# Patient Record
Sex: Male | Born: 1955 | Race: White | Hispanic: No | Marital: Married | Smoking: Former smoker
Health system: Southern US, Community
[De-identification: ages and names within clinical notes are randomized; demographics above are authoritative.]

## PROBLEM LIST (undated history)

## (undated) DIAGNOSIS — C801 Malignant (primary) neoplasm, unspecified: Secondary | ICD-10-CM

## (undated) DIAGNOSIS — I2510 Atherosclerotic heart disease of native coronary artery without angina pectoris: Secondary | ICD-10-CM

## (undated) DIAGNOSIS — O2230 Deep phlebothrombosis in pregnancy, unspecified trimester: Secondary | ICD-10-CM

## (undated) DIAGNOSIS — E785 Hyperlipidemia, unspecified: Secondary | ICD-10-CM

## (undated) DIAGNOSIS — C679 Malignant neoplasm of bladder, unspecified: Secondary | ICD-10-CM

## (undated) HISTORY — DX: Malignant neoplasm of bladder, unspecified: C67.9

## (undated) HISTORY — DX: Hyperlipidemia, unspecified: E78.5

## (undated) HISTORY — DX: Deep phlebothrombosis in pregnancy, unspecified trimester: O22.30

## (undated) HISTORY — PX: WISDOM TOOTH EXTRACTION: SHX21

## (undated) HISTORY — DX: Atherosclerotic heart disease of native coronary artery without angina pectoris: I25.10

---

## 2017-03-21 HISTORY — PX: OTHER SURGICAL HISTORY: SHX169

## 2017-10-18 ENCOUNTER — Other Ambulatory Visit: Payer: Self-pay

## 2017-10-18 ENCOUNTER — Encounter (HOSPITAL_COMMUNITY): Payer: Self-pay | Admitting: Emergency Medicine

## 2017-10-18 ENCOUNTER — Emergency Department (HOSPITAL_COMMUNITY)
Admission: EM | Admit: 2017-10-18 | Discharge: 2017-10-19 | Disposition: A | Discharge status: Home / Self Care | Payer: BLUE CROSS/BLUE SHIELD | Attending: Emergency Medicine | Admitting: Emergency Medicine

## 2017-10-18 ENCOUNTER — Emergency Department (HOSPITAL_COMMUNITY): Payer: BLUE CROSS/BLUE SHIELD

## 2017-10-18 DIAGNOSIS — R600 Localized edema: Secondary | ICD-10-CM | POA: Diagnosis not present

## 2017-10-18 DIAGNOSIS — L0390 Cellulitis, unspecified: Secondary | ICD-10-CM | POA: Diagnosis not present

## 2017-10-18 DIAGNOSIS — N492 Inflammatory disorders of scrotum: Secondary | ICD-10-CM | POA: Diagnosis not present

## 2017-10-18 DIAGNOSIS — I82411 Acute embolism and thrombosis of right femoral vein: Secondary | ICD-10-CM | POA: Diagnosis not present

## 2017-10-18 DIAGNOSIS — N433 Hydrocele, unspecified: Secondary | ICD-10-CM | POA: Diagnosis not present

## 2017-10-18 DIAGNOSIS — M7989 Other specified soft tissue disorders: Secondary | ICD-10-CM

## 2017-10-18 DIAGNOSIS — R2241 Localized swelling, mass and lump, right lower limb: Secondary | ICD-10-CM | POA: Diagnosis not present

## 2017-10-18 DIAGNOSIS — N5089 Other specified disorders of the male genital organs: Secondary | ICD-10-CM | POA: Diagnosis not present

## 2017-10-18 NOTE — ED Provider Notes (Signed)
[facility] Provider Note   CSN: [id number] Arrival date & time: 10/18/17  4431     History   Chief Complaint Chief Complaint  Patient presents with  . Leg Swelling  . Groin Swelling    HPI Robert Peters is a 62 y.o. male.  Patient states he hurt his right groin about 6 months ago while he was working out and has had an intermittent bulge in his groin since then.  Over the past several weeks he is noticed intermittent swelling in his scrotum and right leg.  He did have 2 Recent [address] trips and is quite concerned about a blood clot and was sent here by urgent care.  He states he has not had any vomiting, fever or diarrhea, change in appetite.  He is able to eat and drink well.  Able to urinate and move his bowels normally.  He denies any abdominal pain or back pain.  He denies any chest pain or shortness of breath.  He reports over the past week he is noticed increased swelling in his scrotum as well as the shaft of his penis.  He is able to urinate without a problem.  He said intermittent right leg pain and swelling for the past several weeks as well.  Denies any pain at this time.  He does not take any regular medications.  The history is provided by the patient.    History reviewed. No pertinent past medical history.  There are no active problems to display for this patient.   History reviewed. No pertinent surgical history.      Home Medications    Prior to Admission medications   Not on File    Family History No family history on file.  Social History Social History   Tobacco Use  . Smoking status: Never Smoker  . Smokeless tobacco: Never Used  Substance Use Topics  . Alcohol use: Never    Frequency: Never  . Drug use: Never     Allergies   Patient has no allergy information on record.   Review of Systems Review of Systems  Constitutional: Negative for activity change, appetite change and fever.  HENT:  Negative for congestion and rhinorrhea.   Eyes: Negative for visual disturbance.  Respiratory: Negative for cough, chest tightness and shortness of breath.   Cardiovascular: Positive for leg swelling. Negative for chest pain.  Gastrointestinal: Negative for abdominal pain, nausea and vomiting.  Genitourinary: Positive for penile swelling and scrotal swelling. Negative for dysuria, hematuria and testicular pain.  Musculoskeletal: Negative for arthralgias and myalgias.  Skin: Negative for rash.  Neurological: Negative for dizziness, weakness and headaches.  Hematological: Negative for adenopathy. Does not bruise/bleed easily.    all other systems are negative except as noted in the HPI and PMH.    Physical Exam Updated Vital Signs BP 136/89 (BP Location: Right Arm)   Pulse 69   Temp 98.7 F (37.1 C) (Oral)   Resp 18   Ht 5\' 8"  (1.727 m)   Wt 77.1 kg (170 lb)   SpO2 100%   BMI 25.85 kg/m   Physical Exam  Constitutional: He is oriented to person, place, and time. He appears well-developed and well-nourished. No distress.  HENT:  Head: Normocephalic and atraumatic.  Mouth/Throat: Oropharynx is clear and moist. No oropharyngeal exudate.  Eyes: Pupils are equal, round, and reactive to light. Conjunctivae and EOM are normal.  Neck: Normal range of motion. Neck supple.  No meningismus.  Cardiovascular: Normal rate, regular rhythm, normal heart sounds and intact distal pulses.  No murmur heard. Pulmonary/Chest: Effort normal and breath sounds normal. No respiratory distress.  Abdominal: Soft. There is no tenderness. There is no rebound and no guarding.  No hernia appreciated. R inguinal LAD present.  Genitourinary: Penile tenderness present.  Genitourinary Comments: There is edema to the penile shaft as well as scrotum diffusely.  There is mild erythema.  There is no testicular tenderness.  Intact femoral pulses bilaterally  Musculoskeletal: Normal range of motion. He exhibits  edema. He exhibits no tenderness.  Asymmetric right-sided leg edema.  There is no calf tenderness.  Compartments are soft.  Intact PT pulse with Doppler.  Foot appears cool.  Neurological: He is alert and oriented to person, place, and time. No cranial nerve deficit. He exhibits normal muscle tone. Coordination normal.  No ataxia on finger to nose bilaterally. No pronator drift. 5/5 strength throughout. CN 2-12 intact.Equal grip strength. Sensation intact.   Skin: Skin is warm. No rash noted.  Psychiatric: He has a normal mood and affect. His behavior is normal.  Nursing note and vitals reviewed.    ED Treatments / Results  Labs (all labs ordered are listed, but only abnormal results are displayed) Labs Reviewed  COMPREHENSIVE METABOLIC PANEL - Abnormal; Notable for the following components:      Result Value   Glucose, Bld 100 (*)    BUN 26 (*)    Albumin 3.4 (*)    All other components within normal limits  URINALYSIS, ROUTINE W REFLEX MICROSCOPIC - Abnormal; Notable for the following components:   Color, Urine STRAW (*)    Hgb urine dipstick SMALL (*)    All other components within normal limits  D-DIMER, QUANTITATIVE (NOT AT [facility]) - Abnormal; Notable for the following components:   D-Dimer, Quant 1.37 (*)    All other components within normal limits  CBC WITH DIFFERENTIAL/PLATELET  BRAIN NATRIURETIC PEPTIDE    EKG None  Radiology Ct Angio Ao+bifem W & Or Wo Contrast  Result Date: 10/19/2017 CLINICAL DATA:  Leg pain or swelling. DVT suspected. Patient reports right groin and scrotal swelling. Increased right leg swelling. EXAM: CT ANGIOGRAPHY OF ABDOMINAL AORTA WITH ILIOFEMORAL RUNOFF TECHNIQUE: Multidetector CT imaging of the abdomen, pelvis and lower extremities was performed using the standard protocol during bolus administration of intravenous contrast. Multiplanar CT image reconstructions and MIPs were obtained to evaluate the vascular anatomy. CONTRAST:  193mL  ISOVUE-370 IOPAMIDOL (ISOVUE-370) INJECTION 76% COMPARISON:  None. FINDINGS: VASCULAR Aorta: Normal caliber aorta without aneurysm, dissection, vasculitis or significant stenosis. Celiac: Patent without evidence of aneurysm, dissection, vasculitis or significant stenosis. SMA: Patent without evidence of aneurysm, dissection, vasculitis or significant stenosis. Renals: 3 right and 3 left renal arteries, all of which are patent. No evidence of aneurysm, dissection, vasculitis or fibromuscular dysplasia. IMA: Patent without evidence of aneurysm, dissection, vasculitis or significant stenosis. RIGHT Lower Extremity Inflow: Common, internal and external iliac arteries are patent without evidence of aneurysm, dissection, vasculitis or significant stenosis. Mild atherosclerosis of the right common iliac artery. Outflow: Common, superficial and profunda femoral arteries and the popliteal artery are patent without evidence of aneurysm, dissection, vasculitis or significant stenosis. Runoff: Patent three vessel runoff to the ankle. LEFT Lower Extremity Inflow: Common, internal and external iliac arteries are patent without evidence of aneurysm, dissection, vasculitis or significant stenosis. Outflow: Common, superficial and profunda femoral arteries and the popliteal artery are patent without evidence of aneurysm, dissection, vasculitis or significant stenosis.  Runoff: Patent three vessel runoff to the ankle. Veins: Venous structures not well assessed on this dedicated arterial phase exam. Particularly, cannot assess for DVT. Review of the MIP images confirms the above findings. NON-VASCULAR Lower chest: Atelectasis in both lower lobes.  No pleural fluid. Hepatobiliary: Subcentimeter enhancing focus in the right lobe of the liver nonspecific, may be an AP shunt or flash filling hemangioma. Subcentimeter cyst in the left hepatic lobe. Arterial phase exam not tailored for detailed hepatic evaluation. Gallbladder physiologically  distended, no calcified stone. No biliary dilatation. Pancreas: No ductal dilatation or inflammation. Spleen: Normal arterial phase enhancement.  Normal in size. Adrenals/Urinary Tract: No adrenal nodule. Extrarenal pelvis configuration on the right. No hydronephrosis. No perinephric edema. Curvilinear hyperdensity in the region of the right urinary trigone, mild diffuse bladder wall thickening. Stomach/Bowel: Bowel assessment is limited in the absence of enteric contrast, arterial phase imaging, paucity of intra-abdominal fat. Allowing for this, no evidence of bowel wall thickening or inflammatory change. Moderate stool throughout the colon. Lymphatic: Assessment for adenopathy is limited given arterial phase imaging and paucity of intra-abdominal fat. There is right inguinal adenopathy measuring up to 16 mm. Prominent nodes in the left inguinal region. Possible left periaortic adenopathy measuring 12 mm short axis, alternatively this may represent venous structures. Reproductive: Prominent prostate gland spanning 5.7 cm. Diffuse edema and thickening of the scrotal skin. No soft tissue air. Other: Diffuse skin thickening and subcutaneous edema of the subcutaneous tissues of the right lower extremity, extending from the thigh through the foot. No obvious intramuscular fluid collection. Prominent right popliteal node measures 10 mm. 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 IMPRESSION: VASCULAR 1. No acute arterial abnormality.  Only minimal atherosclerosis. 2. If there is clinical concern for DVT as stated in history, recommend sonographic evaluation with duplex. Veins are not assessed on this arterial phase exam. NON-VASCULAR 1. Diffuse skin thickening and subcutaneous edema of the right lower extremity. Diffuse scrotal skin thickening and edema. Findings are nonspecific, favor infectious. 2. Inguinal and right popliteal adenopathy, presumably reactive.  Equivocal left periaortic adenopathy versus retroperitoneal venous structures, suboptimally assessed. 3. Mild bladder wall thickening, nonspecific. Curvilinear high density in the region of the right bladder trigone may be stones in the distal ureter or bladder, bladder wall calcifications, or prostatic calcifications. 4. 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 Electronically Signed   By: Jeb Levering M.D.   On: 10/19/2017 02:56   US Scrotum W/doppler  Result Date: 10/19/2017 CLINICAL DATA:  Groin swelling. EXAM: SCROTAL ULTRASOUND DOPPLER ULTRASOUND OF THE TESTICLES TECHNIQUE: Complete ultrasound examination of the testicles, epididymis, and other scrotal structures was performed. Color and spectral Doppler ultrasound were also utilized to evaluate blood flow to the testicles. COMPARISON:  None. FINDINGS: Right testicle Measurements: 4.5 x 3.2 x 3.1 cm. Homogeneous echogenicity with normal blood flow. No mass or microlithiasis visualized. Left testicle Measurements: 3.9 x 2.6 x 2.6 cm. Homogeneous echogenicity with normal blood flow. No mass or microlithiasis visualized. Right epididymis:  Normal in size and appearance. Left epididymis:  Normal in size and appearance. Hydrocele:  Moderate on the right, complex.  Small on the left. Varicocele:  Bilateral. Pulsed Doppler interrogation of both testes demonstrates normal low resistance arterial and venous waveforms bilaterally. Diffuse symmetric scrotal skin thickening up to 1 cm. IMPRESSION: 1. Diffuse scrotal skin thickening which may be infectious or inflammatory. 2. Moderate complex right hydrocele.  Tiny left hydrocele.  3. Bilateral varicoceles. 4. Normal sonographic appearance of the testes with normal blood flow. Electronically Signed   By: Jeb Levering M.D.   On: 10/19/2017 01:44    Procedures Procedures (including critical care time)  Medications Ordered in ED Medications - No data to  display   Initial Impression / Assessment and Plan / ED Course  I have reviewed the triage vital signs and the nursing notes.  Pertinent labs & imaging results that were available during my care of the patient were reviewed by me and considered in my medical decision making (see chart for details).    Patient with several weeks of swelling to his right leg as well as his scrotum and penis.  Denies any pain.  Denies any difficulty with urination.  No fever or vomiting.  Asymmetric right leg swelling concerning for DVT.  Distal pulses are intact and compartments are soft.  Doppler unavailable at this time.  Patient also with erythema and swelling of his scrotum as well as penile shaft.  There is no testicular tenderness.  He is able to urinate.  There are palpable lymph nodes in his right groin.  Suspect scrotal cellulitis as well as possible DVT though swelling in scrotum may be related to DVT as well. No CP or SOB to suggest PE.  There is no evidence of acute vascular occlusion on CTA.  There is evidence of scrotal edema and possibly infection.  No evidence of testicular torsion.  Patient nontoxic and well-appearing.  He will be given a dose of Lovenox and scheduled for Doppler ultrasound tomorrow.  He is also given antibiotics for possible scrotal cellulitis and urology follow-up. Return precautions discussed.  Final Clinical Impressions(s) / ED Diagnoses   Final diagnoses:  Right leg swelling  Cellulitis of scrotum    ED Discharge Orders    None       Travell Desaulniers, Annie Main, MD 10/19/17 [phone]

## 2017-10-18 NOTE — ED Triage Notes (Addendum)
Pt reports rt groin and scrotum swelling and rt leg swelling that has increased over the last few weeks. Pt reports taking two long road trip over the two weeks. Denies any other sx or pain. Pt ambulatory in triage. Pt has no medical hx and does not take any medications.

## 2017-10-19 ENCOUNTER — Emergency Department (HOSPITAL_BASED_OUTPATIENT_CLINIC_OR_DEPARTMENT_OTHER)
Admission: RE | Admit: 2017-10-19 | Discharge: 2017-10-19 | Disposition: A | Discharge status: Home / Self Care | Payer: BLUE CROSS/BLUE SHIELD | Source: Ambulatory Visit | Attending: Emergency Medicine | Admitting: Emergency Medicine

## 2017-10-19 ENCOUNTER — Emergency Department (HOSPITAL_COMMUNITY): Payer: BLUE CROSS/BLUE SHIELD

## 2017-10-19 ENCOUNTER — Other Ambulatory Visit: Payer: Self-pay

## 2017-10-19 ENCOUNTER — Emergency Department (HOSPITAL_COMMUNITY)
Admission: EM | Admit: 2017-10-19 | Discharge: 2017-10-19 | Disposition: A | Discharge status: Home / Self Care | Payer: BLUE CROSS/BLUE SHIELD | Source: Home / Self Care | Attending: Emergency Medicine | Admitting: Emergency Medicine

## 2017-10-19 DIAGNOSIS — R600 Localized edema: Secondary | ICD-10-CM | POA: Diagnosis not present

## 2017-10-19 DIAGNOSIS — Z79899 Other long term (current) drug therapy: Secondary | ICD-10-CM | POA: Insufficient documentation

## 2017-10-19 DIAGNOSIS — M7989 Other specified soft tissue disorders: Secondary | ICD-10-CM

## 2017-10-19 DIAGNOSIS — I82411 Acute embolism and thrombosis of right femoral vein: Secondary | ICD-10-CM

## 2017-10-19 DIAGNOSIS — N433 Hydrocele, unspecified: Secondary | ICD-10-CM | POA: Diagnosis not present

## 2017-10-19 LAB — CBC WITH DIFFERENTIAL/PLATELET
ABS IMMATURE GRANULOCYTES: 0 10*3/uL (ref 0.0–0.1)
BASOS PCT: 1 %
Basophils Absolute: 0.1 10*3/uL (ref 0.0–0.1)
EOS ABS: 0.3 10*3/uL (ref 0.0–0.7)
Eosinophils Relative: 4 %
HEMATOCRIT: 43.8 % (ref 39.0–52.0)
Hemoglobin: 14.4 g/dL (ref 13.0–17.0)
IMMATURE GRANULOCYTES: 0 %
LYMPHS ABS: 2.1 10*3/uL (ref 0.7–4.0)
Lymphocytes Relative: 29 %
MCH: 30.4 pg (ref 26.0–34.0)
MCHC: 32.9 g/dL (ref 30.0–36.0)
MCV: 92.6 fL (ref 78.0–100.0)
Monocytes Absolute: 0.6 10*3/uL (ref 0.1–1.0)
Monocytes Relative: 8 %
NEUTROS ABS: 4.2 10*3/uL (ref 1.7–7.7)
NEUTROS PCT: 58 %
PLATELETS: 184 10*3/uL (ref 150–400)
RBC: 4.73 MIL/uL (ref 4.22–5.81)
RDW: 11.9 % (ref 11.5–15.5)
WBC: 7.3 10*3/uL (ref 4.0–10.5)

## 2017-10-19 LAB — COMPREHENSIVE METABOLIC PANEL
ALBUMIN: 3.4 g/dL — AB (ref 3.5–5.0)
ALT: 20 U/L (ref 0–44)
AST: 26 U/L (ref 15–41)
Alkaline Phosphatase: 54 U/L (ref 38–126)
Anion gap: 12 (ref 5–15)
BUN: 26 mg/dL — AB (ref 8–23)
CHLORIDE: 103 mmol/L (ref 98–111)
CO2: 26 mmol/L (ref 22–32)
Calcium: 8.9 mg/dL (ref 8.9–10.3)
Creatinine, Ser: 0.95 mg/dL (ref 0.61–1.24)
GFR calc Af Amer: >60 mL/min (ref >60)
GFR calc non Af Amer: >60 mL/min (ref >60)
GLUCOSE: 100 mg/dL — AB (ref 70–99)
POTASSIUM: 3.9 mmol/L (ref 3.5–5.1)
Sodium: 141 mmol/L (ref 135–145)
Total Bilirubin: 0.7 mg/dL (ref 0.3–1.2)
Total Protein: 7.7 g/dL (ref 6.5–8.1)

## 2017-10-19 LAB — URINALYSIS, ROUTINE W REFLEX MICROSCOPIC
BACTERIA UA: NONE SEEN
Bilirubin Urine: NEGATIVE
Glucose, UA: NEGATIVE mg/dL
Ketones, ur: NEGATIVE mg/dL
LEUKOCYTES UA: NEGATIVE
Nitrite: NEGATIVE
PROTEIN: NEGATIVE mg/dL
Specific Gravity, Urine: 1.009 (ref 1.005–1.030)
pH: 7 (ref 5.0–8.0)

## 2017-10-19 LAB — D-DIMER, QUANTITATIVE: D-Dimer, Quant: 1.37 ug/mL-FEU — ABNORMAL HIGH (ref 0.00–0.50)

## 2017-10-19 LAB — BRAIN NATRIURETIC PEPTIDE: B Natriuretic Peptide: 24.6 pg/mL (ref 0.0–100.0)

## 2017-10-19 MED ORDER — RIVAROXABAN (XARELTO) EDUCATION KIT FOR DVT/PE PATIENTS
PACK | Freq: Once | Status: AC
  Administered 2017-10-19: 13:00:00
  Filled 2017-10-19: qty 1

## 2017-10-19 MED ORDER — DOXYCYCLINE HYCLATE 100 MG PO CAPS: 100.0000 mg | ORAL_CAPSULE | Freq: Two times a day (BID) | ORAL | 0 refills | Status: DC

## 2017-10-19 MED ORDER — RIVAROXABAN 15 MG PO TABS
15.0000 mg | ORAL_TABLET | Freq: Once | ORAL | Status: DC
  Filled 2017-10-19: qty 1

## 2017-10-19 MED ORDER — ENOXAPARIN SODIUM 80 MG/0.8ML ~~LOC~~ SOLN
1.0000 mg/kg | Freq: Once | SUBCUTANEOUS | Status: AC
  Administered 2017-10-19: 75 mg via SUBCUTANEOUS
  Filled 2017-10-19: qty 0.75

## 2017-10-19 MED ORDER — IOPAMIDOL (ISOVUE-370) INJECTION 76%
INTRAVENOUS | Status: AC
  Filled 2017-10-19: qty 100

## 2017-10-19 MED ORDER — DOXYCYCLINE HYCLATE 100 MG PO TABS
100.0000 mg | ORAL_TABLET | Freq: Once | ORAL | Status: AC
  Administered 2017-10-19: 100 mg via ORAL
  Filled 2017-10-19: qty 1

## 2017-10-19 MED ORDER — DOXYCYCLINE HYCLATE 100 MG PO TABS
100.0000 mg | ORAL_TABLET | Freq: Once | ORAL | Status: AC
Start: 2017-10-19 — End: 2017-10-19
  Administered 2017-10-19: 100 mg via ORAL
  Filled 2017-10-19: qty 1

## 2017-10-19 MED ORDER — RIVAROXABAN 20 MG PO TABS: 20.0000 mg | ORAL_TABLET | Freq: Every day | ORAL | Status: DC

## 2017-10-19 MED ORDER — RIVAROXABAN (XARELTO) VTE STARTER PACK (15 & 20 MG): ORAL_TABLET | ORAL | 0 refills | Status: DC

## 2017-10-19 MED ORDER — RIVAROXABAN 15 MG PO TABS
15.0000 mg | ORAL_TABLET | Freq: Two times a day (BID) | ORAL | Status: DC
  Filled 2017-10-19: qty 1

## 2017-10-19 MED ORDER — IOPAMIDOL (ISOVUE-370) INJECTION 76%
100.0000 mL | Freq: Once | INTRAVENOUS | Status: AC | PRN
  Administered 2017-10-19: 100 mL via INTRAVENOUS

## 2017-10-19 NOTE — Discharge Instructions (Addendum)
follow-up for an ultrasound of your leg tomorrow to rule out blood clot.  Take the antibiotics as prescribed and follow-up with the urologist.  Return to the ED if you develop fever, worsening redness or pain to your groin or scrotum, difficulty with urination, vomiting, abdominal pain, chest pain, shortness of breath or any other concerns.

## 2017-10-19 NOTE — Progress Notes (Signed)
ANTICOAGULATION CONSULT NOTE - Initial Consult  Pharmacy Consult for xarelto Indication: DVT  No Known Allergies  Patient Measurements:    Vital Signs: Temp: 98.3 F (36.8 C) (08/01 1146) Temp Source: Oral (08/01 1146) BP: 126/83 (08/01 1146) Pulse Rate: 63 (08/01 1146)  Labs: Recent Labs    10/19/17 0013  HGB 14.4  HCT 43.8  PLT 184  CREATININE 0.95    Estimated Creatinine Clearance: 78 mL/min (by C-G formula based on SCr of 0.95 mg/dL).   Medical History: No past medical history on file.  Assessment: 47 yom with acute DVT to start xarelto for anticoagulation. He received a dose of therapeutic lovenox at 0400 this AM. Baseline CBC is WNL and pt with adequate renal function.   Goal of Therapy:  Therapeutic anticoagulation   Plan:  Xarelto 15mg  PO BID x 21 days then 20mg  daily thereafter F/u renal fxn, S&S of bleeding Patient and wife educated, coupons provided  Kynsli Haapala, Rande Lawman 10/19/2017,12:28 PM

## 2017-10-19 NOTE — ED Notes (Signed)
Patient transported to CT 

## 2017-10-19 NOTE — ED Notes (Signed)
Both pedal pulses found with doppler.

## 2017-10-19 NOTE — Discharge Instructions (Signed)
Thank you for allowing me to care for you today in the Emergency Department.   Take Xarelto as prescribed.  Make sure you give the coupon you received to the pharmacy for your prescription.  This prescription is for a one-month supply.  He will need to obtain the second and third month prescription from your primary care provider.  It is very important that you take this medication as prescribed and do not miss any doses.  Call the number on your discharge paperwork to get established with a primary care provider for follow-up.  Take the doxycycline as prescribed by Dr. Wyvonnia Dusky and follow-up with [facility] per your other discharge instructions.  Return to the emergency department if you develop new or worsening symptoms including shortness of breath, chest pain, severe headache, changes in your vision, or if you have a fall and hit your head while taking Xarelto.

## 2017-10-19 NOTE — Progress Notes (Signed)
Preliminary notes--Right lower extremity venous duplex exam completed.  Acute positive thrombosis involving right profunda vein prox and femoral vein distal segment.  Multiple lymph nodes seen at groin area, measuring 1.26x1.70x1.02cm 1.29x0.76x1.28cm. Patient was guided to ED to check in right after exam completed.  Hongying Kiki Bivens (RDMS RVT) 10/19/17 9:03 AM

## 2017-10-19 NOTE — ED Triage Notes (Signed)
Pt Arrives from vascular after a positive blood clot identified in lower right leg.

## 2017-10-19 NOTE — ED Provider Notes (Signed)
[facility] EMERGENCY DEPARTMENT Provider Note   CSN: [id number] Arrival date & time: 10/19/17  0856     History   Chief Complaint Chief Complaint  Patient presents with  . Leg Swelling    HPI Robert Peters. is a 62 y.o. male with no pertinent past medical history who presents to the emergency department with a chief complaint of abnormal test result.  The patient was seen  Last night in the emergency department for intermittent swelling in his scrotum and right leg.  He had 2 Recent [address] trips is concerned he might have a blood clot.  He was referred to the ED by urgent care.  He was diagnosed with cellulitis of the scrotum and started on doxycycline.  He was given a dose of Lovenox and referred for DVT study this morning.  He reports minimal pain in the calf, but reports that the swelling began in his ankle and has steadily moved superiorly up his leg.  He denies chest pain, dyspnea, dizziness, lightheadedness, or headache.  No chronic medical problems.  No known family history of DVT or PE.  No history of cancer.  No recent surgery or immobilization.  The history is provided by the patient. No language interpreter was used.    No past medical history on file.  There are no active problems to display for this patient.   No past surgical history on file.      Home Medications    Prior to Admission medications   Medication Sig Start Date End Date Taking? Authorizing Provider  doxycycline (VIBRAMYCIN) 100 MG capsule Take 1 capsule (100 mg total) by mouth 2 (two) times daily. 10/19/17  Yes Rancour, Annie Main, MD  FERROUS SULFATE PO Take 1 tablet by mouth daily.   Yes [provider]  Multiple Vitamin (MULTIVITAMIN WITH MINERALS) TABS tablet Take 1 tablet by mouth daily.   Yes [provider]  Multiple Vitamins-Minerals (ZINC PO) Take 1 tablet by mouth daily.   Yes [provider]  Probiotic Product (PROBIOTIC PO) Take 1  capsule by mouth daily.   Yes [provider]  VITAMIN E PO Take 1 capsule by mouth daily.   Yes [provider]  Rivaroxaban 15 & 20 MG TBPK Take as directed on package: Start with one 64m tablet by mouth twice a day with food. On Day 22, switch to one 232mtablet once a day with food. 10/19/17   McDonald, Mia A, PA-C    Family History No family history on file.  Social History Social History   Tobacco Use  . Smoking status: Never Smoker  . Smokeless tobacco: Never Used  Substance Use Topics  . Alcohol use: Never    Frequency: Never  . Drug use: Never     Allergies   Patient has no known allergies.   Review of Systems Review of Systems  Constitutional: Negative for appetite change, chills and fever.  Respiratory: Negative for shortness of breath.   Cardiovascular: Negative for chest pain.  Gastrointestinal: Negative for abdominal pain, constipation, nausea and vomiting.  Genitourinary: Positive for scrotal swelling. Negative for dysuria.  Musculoskeletal: Positive for myalgias. Negative for arthralgias, back pain and gait problem.  Skin: Negative for rash.  Allergic/Immunologic: Negative for immunocompromised state.  Neurological: Negative for weakness, numbness and headaches.  Psychiatric/Behavioral: Negative for confusion.   Physical Exam Updated Vital Signs BP 115/81   Pulse 65   Temp 98.3 F (36.8 C) (Oral)   Resp  17   SpO2 98%   Physical Exam  Constitutional: He appears well-developed.  HENT:  Head: Normocephalic.  Eyes: Conjunctivae are normal.  Neck: Neck supple.  Cardiovascular: Normal rate, regular rhythm, normal heart sounds and intact distal pulses. Exam reveals no gallop and no friction rub.  No murmur heard. Pulmonary/Chest: Effort normal and breath sounds normal. No stridor. No respiratory distress. He has no wheezes. He has no rales. He exhibits no tenderness.  Abdominal: Soft. He exhibits no distension and no mass. There is no  tenderness. There is no rebound and no guarding. No hernia.  Musculoskeletal: He exhibits edema.  Right lower extremity edema.  No right calf tenderness.  DP and PT pulses are 2+ and symmetric.  Sensation is intact and equal throughout.  5 out of 5 strength against resistance of the bilateral lower extremities.  Neurological: He is alert.  Skin: Skin is warm and dry. Capillary refill takes less than 2 seconds. No rash noted. No erythema.  Psychiatric: His behavior is normal.  Nursing note and vitals reviewed.  ED Treatments / Results  Labs (all labs ordered are listed, but only abnormal results are displayed) Labs Reviewed - No data to display  EKG None  Radiology Ct Angio Ao+bifem W & Or Wo Contrast  Result Date: 10/19/2017 CLINICAL DATA:  Leg pain or swelling. DVT suspected. Patient reports right groin and scrotal swelling. Increased right leg swelling. EXAM: CT ANGIOGRAPHY OF ABDOMINAL AORTA WITH ILIOFEMORAL RUNOFF TECHNIQUE: Multidetector CT imaging of the abdomen, pelvis and lower extremities was performed using the standard protocol during bolus administration of intravenous contrast. Multiplanar CT image reconstructions and MIPs were obtained to evaluate the vascular anatomy. CONTRAST:  198m ISOVUE-370 IOPAMIDOL (ISOVUE-370) INJECTION 76% COMPARISON:  None. FINDINGS: VASCULAR Aorta: Normal caliber aorta without aneurysm, dissection, vasculitis or significant stenosis. Celiac: Patent without evidence of aneurysm, dissection, vasculitis or significant stenosis. SMA: Patent without evidence of aneurysm, dissection, vasculitis or significant stenosis. Renals: 3 right and 3 left renal arteries, all of which are patent. No evidence of aneurysm, dissection, vasculitis or fibromuscular dysplasia. IMA: Patent without evidence of aneurysm, dissection, vasculitis or significant stenosis. RIGHT Lower Extremity Inflow: Common, internal and external iliac arteries are patent without evidence of aneurysm,  dissection, vasculitis or significant stenosis. Mild atherosclerosis of the right common iliac artery. Outflow: Common, superficial and profunda femoral arteries and the popliteal artery are patent without evidence of aneurysm, dissection, vasculitis or significant stenosis. Runoff: Patent three vessel runoff to the ankle. LEFT Lower Extremity Inflow: Common, internal and external iliac arteries are patent without evidence of aneurysm, dissection, vasculitis or significant stenosis. Outflow: Common, superficial and profunda femoral arteries and the popliteal artery are patent without evidence of aneurysm, dissection, vasculitis or significant stenosis. Runoff: Patent three vessel runoff to the ankle. Veins: Venous structures not well assessed on this dedicated arterial phase exam. Particularly, cannot assess for DVT. Review of the MIP images confirms the above findings. NON-VASCULAR Lower chest: Atelectasis in both lower lobes.  No pleural fluid. Hepatobiliary: Subcentimeter enhancing focus in the right lobe of the liver nonspecific, may be an AP shunt or flash filling hemangioma. Subcentimeter cyst in the left hepatic lobe. Arterial phase exam not tailored for detailed hepatic evaluation. Gallbladder physiologically distended, no calcified stone. No biliary dilatation. Pancreas: No ductal dilatation or inflammation. Spleen: Normal arterial phase enhancement.  Normal in size. Adrenals/Urinary Tract: No adrenal nodule. Extrarenal pelvis configuration on the right. No hydronephrosis. No perinephric edema. Curvilinear hyperdensity in the region of the  right urinary trigone, mild diffuse bladder wall thickening. Stomach/Bowel: Bowel assessment is limited in the absence of enteric contrast, arterial phase imaging, paucity of intra-abdominal fat. Allowing for this, no evidence of bowel wall thickening or inflammatory change. Moderate stool throughout the colon. Lymphatic: Assessment for adenopathy is limited given  arterial phase imaging and paucity of intra-abdominal fat. There is right inguinal adenopathy measuring up to 16 mm. Prominent nodes in the left inguinal region. Possible left periaortic adenopathy measuring 12 mm short axis, alternatively this may represent venous structures. Reproductive: Prominent prostate gland spanning 5.7 cm. Diffuse edema and thickening of the scrotal skin. No soft tissue air. Other: Diffuse skin thickening and subcutaneous edema of the subcutaneous tissues of the right lower extremity, extending from the thigh through the foot. No obvious intramuscular fluid collection. Prominent right popliteal node measures 10 mm. 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 IMPRESSION: VASCULAR 1. No acute arterial abnormality.  Only minimal atherosclerosis. 2. If there is clinical concern for DVT as stated in history, recommend sonographic evaluation with duplex. Veins are not assessed on this arterial phase exam. NON-VASCULAR 1. Diffuse skin thickening and subcutaneous edema of the right lower extremity. Diffuse scrotal skin thickening and edema. Findings are nonspecific, favor infectious. 2. Inguinal and right popliteal adenopathy, presumably reactive. Equivocal left periaortic adenopathy versus retroperitoneal venous structures, suboptimally assessed. 3. Mild bladder wall thickening, nonspecific. Curvilinear high density in the region of the right bladder trigone may be stones in the distal ureter or bladder, bladder wall calcifications, or prostatic calcifications. 4. 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 Electronically Signed   By: Jeb Levering M.D.   On: 10/19/2017 02:56   US Scrotum W/doppler  Result Date: 10/19/2017 CLINICAL DATA:  Groin swelling. EXAM: SCROTAL ULTRASOUND DOPPLER ULTRASOUND OF THE TESTICLES TECHNIQUE: Complete ultrasound examination of the testicles, epididymis, and  other scrotal structures was performed. Color and spectral Doppler ultrasound were also utilized to evaluate blood flow to the testicles. COMPARISON:  None. FINDINGS: Right testicle Measurements: 4.5 x 3.2 x 3.1 cm. Homogeneous echogenicity with normal blood flow. No mass or microlithiasis visualized. Left testicle Measurements: 3.9 x 2.6 x 2.6 cm. Homogeneous echogenicity with normal blood flow. No mass or microlithiasis visualized. Right epididymis:  Normal in size and appearance. Left epididymis:  Normal in size and appearance. Hydrocele:  Moderate on the right, complex.  Small on the left. Varicocele:  Bilateral. Pulsed Doppler interrogation of both testes demonstrates normal low resistance arterial and venous waveforms bilaterally. Diffuse symmetric scrotal skin thickening up to 1 cm. IMPRESSION: 1. Diffuse scrotal skin thickening which may be infectious or inflammatory. 2. Moderate complex right hydrocele.  Tiny left hydrocele. 3. Bilateral varicoceles. 4. Normal sonographic appearance of the testes with normal blood flow. Electronically Signed   By: Jeb Levering M.D.   On: 10/19/2017 01:44    Procedures Procedures (including critical care time)  Medications Ordered in ED Medications  rivaroxaban [employer] [employer]) Education Kit for DVT/PE patients ( Does not apply Given 10/19/17 1235)  doxycycline (VIBRA-TABS) tablet 100 mg (100 mg Oral Given 10/19/17 1234)     Initial Impression / Assessment and Plan / ED Course  I have reviewed the triage vital signs and the nursing notes.  Pertinent labs & imaging results that were available during my care of the patient were reviewed by me and considered in my medical decision making (see chart for details).     62 year old  male with no pertinent past medical history presenting for follow-up ED visit after he had a positive DVT study this morning.  Right lower extremity duplex exam demonstrating acute positive thrombosis involving the right profunda vein  proximally and femoral vein distally.  Multiple inguinal lymph nodes are also seen on the study.  He has not had a dose of doxycycline since his ED visit last night.  A.m. dose of doxycycline given today.  He had a dose of Lovenox last night and will defer Xarelto dose until tonight.  Consulted pharmacy who provided the patient with education on Xarelto.  The patient and his wife had many questions regarding his diagnosis.  All questions were answered.  Recommended the patient get established with primary care.  He seems reliable for follow-up.  Will discharge the patient with a starter pack of Xarelto.  Strict return precautions given.  He is hemodynamically stable and in no acute distress.  He is safe for discharge home at this time with outpatient follow up.   Final Clinical Impressions(s) / ED Diagnoses   Final diagnoses:  Acute deep vein thrombosis (DVT) of femoral vein of right lower extremity [facility])    ED Discharge Orders        Ordered    Rivaroxaban 15 & 20 MG TBPK     10/19/17 1318       McDonald, Mia A, PA-C 10/19/17 1334    Julianne Rice, MD 10/21/17 1558

## 2017-10-21 ENCOUNTER — Encounter (HOSPITAL_COMMUNITY): Payer: Self-pay | Admitting: Emergency Medicine

## 2017-10-21 ENCOUNTER — Emergency Department (HOSPITAL_COMMUNITY)
Admission: EM | Admit: 2017-10-21 | Discharge: 2017-10-21 | Disposition: A | Discharge status: Home / Self Care | Payer: BLUE CROSS/BLUE SHIELD | Attending: Emergency Medicine | Admitting: Emergency Medicine

## 2017-10-21 ENCOUNTER — Other Ambulatory Visit: Payer: Self-pay

## 2017-10-21 DIAGNOSIS — R319 Hematuria, unspecified: Secondary | ICD-10-CM | POA: Diagnosis not present

## 2017-10-21 DIAGNOSIS — Z79899 Other long term (current) drug therapy: Secondary | ICD-10-CM | POA: Diagnosis not present

## 2017-10-21 DIAGNOSIS — Z7901 Long term (current) use of anticoagulants: Secondary | ICD-10-CM | POA: Diagnosis not present

## 2017-10-21 LAB — URINALYSIS, ROUTINE W REFLEX MICROSCOPIC
Bacteria, UA: NONE SEEN
Bilirubin Urine: NEGATIVE
Glucose, UA: NEGATIVE mg/dL
Ketones, ur: NEGATIVE mg/dL
Leukocytes, UA: NEGATIVE
Nitrite: NEGATIVE
Protein, ur: 100 mg/dL — AB
RBC / HPF: >50 RBC/hpf — ABNORMAL HIGH (ref 0–5)
Specific Gravity, Urine: 1.023 (ref 1.005–1.030)
pH: 6 (ref 5.0–8.0)

## 2017-10-21 NOTE — ED Notes (Addendum)
Spoke to pt per charge RN, pt aware of need for urine sample, has a sample cup and states that he will provide sample ASAP

## 2017-10-21 NOTE — ED Provider Notes (Signed)
[place] EMERGENCY DEPARTMENT Provider Note   CSN: [id number] Arrival date & time: 10/21/17  0941     History   Chief Complaint Chief Complaint  Patient presents with  . Hematuria    HPI Robert C Keiton Cosma. is a 62 y.o. male.  HPI   63 year old male with painless hematuria.  Patient was just seen and evaluated for DVT on 8/1.  He received an injection of Lovenox and then was started on Xarelto.  Noticed pink and then darker red urine earlier today which has persisted.  No dysuria.  No overt bleeding elsewhere.  No easy bruising.  History reviewed. No pertinent past medical history.  There are no active problems to display for this patient.   History reviewed. No pertinent surgical history.      Home Medications    Prior to Admission medications   Medication Sig Start Date End Date Taking? Authorizing Provider  doxycycline (VIBRAMYCIN) 100 MG capsule Take 1 capsule (100 mg total) by mouth 2 (two) times daily. 10/19/17   Rancour, Annie Main, MD  FERROUS SULFATE PO Take 1 tablet by mouth daily.    [provider]  Multiple Vitamin (MULTIVITAMIN WITH MINERALS) TABS tablet Take 1 tablet by mouth daily.    [provider]  Multiple Vitamins-Minerals (ZINC PO) Take 1 tablet by mouth daily.    [provider]  Probiotic Product (PROBIOTIC PO) Take 1 capsule by mouth daily.    [provider]  Rivaroxaban 15 & 20 MG TBPK Take as directed on package: Start with one 15mg  tablet by mouth twice a day with food. On Day 22, switch to one 20mg  tablet once a day with food. 10/19/17   McDonald, Mia A, PA-C  VITAMIN E PO Take 1 capsule by mouth daily.    [provider]    Family History History reviewed. No pertinent family history.  Social History Social History   Tobacco Use  . Smoking status: Never Smoker  . Smokeless tobacco: Never Used  Substance Use Topics  . Alcohol use: Never    Frequency: Never  . Drug use:  Never     Allergies   Patient has no known allergies.   Review of Systems Review of Systems  All systems reviewed and negative, other than as noted in HPI.  Physical Exam Updated Vital Signs BP 132/83 (BP Location: Right Arm)   Pulse 84   Temp 98.3 F (36.8 C) (Oral)   Resp 20   SpO2 99%   Physical Exam  Gen: Awake. Alert. NAD. Eyes: no discharge. HEENT: normocephalic. Atraumatic. Lungs: CTAB Heart: RRR. No murmur. Abd: soft. NT. Skin: Warm. Dry. No concerning lesions noted.  Psych: Normal affect.   ED Treatments / Results  Labs (all labs ordered are listed, but only abnormal results are displayed) Labs Reviewed  URINALYSIS, ROUTINE W REFLEX MICROSCOPIC - Abnormal; Notable for the following components:      Result Value   Color, Urine ORANGE (*)    APPearance CLOUDY (*)    Hgb urine dipstick LARGE (*)    Protein, ur 100 (*)    RBC / HPF >50 (*)    Non Squamous Epithelial 0-5 (*)    All other components within normal limits    EKG None  Radiology No results found.  Procedures Procedures (including critical care time)  Medications Ordered in ED Medications - No data to display   Initial Impression / Assessment and Plan / ED Course  I have  reviewed the triage vital signs and the nursing notes.  Pertinent labs & imaging results that were available during my care of the patient were reviewed by me and considered in my medical decision making (see chart for details).     62 year old male with hematuria.  He is recently given Lovenox and then started on Xarelto for DVT.  Hematuria started after this.  I suspect that the Xarelto may have potentially unmasked an underlying issue.  On his recent imaging there were some irregularities noted in his bladder.  He actually has an upcoming urology appointment this upcoming Thursday.  Advised to discuss further at that time.  My concern would be primarily r/o malignancy particularly with this DVT w/o obvious  precipitant. UA does not show evidence of UTI.  H&H few days ago was normal.  Denies any symptoms of acute blood loss anemia.  He was advised to continue his Xarelto for the time being.  Final Clinical Impressions(s) / ED Diagnoses   Final diagnoses:  Hematuria, unspecified type    ED Discharge Orders    None       Virgel Manifold, MD 10/25/17 1544

## 2017-10-21 NOTE — ED Notes (Signed)
Patient verbalizes understanding of discharge instructions. Opportunity for questioning and answers were provided. Armband removed by staff, pt discharged from ED.  

## 2017-10-21 NOTE — ED Triage Notes (Signed)
Pt to ER for evaluation of hematuria onset yesterday. States diagnosed with DVT Wednesday. Started on Xarelto.

## 2017-10-21 NOTE — Discharge Instructions (Signed)
Continue xarelto for now. Avoid NSAIDs (ibuprofen, aleve, naprosyn, etc). Follow-up with urology this week as previously scheduled.

## 2017-10-23 DIAGNOSIS — R600 Localized edema: Secondary | ICD-10-CM | POA: Diagnosis not present

## 2017-10-23 DIAGNOSIS — Z125 Encounter for screening for malignant neoplasm of prostate: Secondary | ICD-10-CM | POA: Diagnosis not present

## 2017-10-23 DIAGNOSIS — R310 Gross hematuria: Secondary | ICD-10-CM | POA: Diagnosis not present

## 2017-10-23 DIAGNOSIS — L03314 Cellulitis of groin: Secondary | ICD-10-CM | POA: Diagnosis not present

## 2017-11-03 DIAGNOSIS — I82401 Acute embolism and thrombosis of unspecified deep veins of right lower extremity: Secondary | ICD-10-CM | POA: Diagnosis not present

## 2017-11-15 DIAGNOSIS — I82401 Acute embolism and thrombosis of unspecified deep veins of right lower extremity: Secondary | ICD-10-CM | POA: Diagnosis not present

## 2017-11-19 HISTORY — PX: CYSTOSCOPY: SUR368

## 2017-11-22 DIAGNOSIS — R310 Gross hematuria: Secondary | ICD-10-CM | POA: Diagnosis not present

## 2017-11-28 DIAGNOSIS — R310 Gross hematuria: Secondary | ICD-10-CM | POA: Diagnosis not present

## 2017-11-28 DIAGNOSIS — N132 Hydronephrosis with renal and ureteral calculous obstruction: Secondary | ICD-10-CM | POA: Diagnosis not present

## 2017-12-01 DIAGNOSIS — I82401 Acute embolism and thrombosis of unspecified deep veins of right lower extremity: Secondary | ICD-10-CM | POA: Diagnosis not present

## 2017-12-01 DIAGNOSIS — I890 Lymphedema, not elsewhere classified: Secondary | ICD-10-CM | POA: Diagnosis not present

## 2017-12-04 DIAGNOSIS — R590 Localized enlarged lymph nodes: Secondary | ICD-10-CM | POA: Diagnosis not present

## 2017-12-04 DIAGNOSIS — R310 Gross hematuria: Secondary | ICD-10-CM | POA: Diagnosis not present

## 2017-12-04 DIAGNOSIS — C672 Malignant neoplasm of lateral wall of bladder: Secondary | ICD-10-CM | POA: Diagnosis not present

## 2017-12-04 DIAGNOSIS — N1339 Other hydronephrosis: Secondary | ICD-10-CM | POA: Diagnosis not present

## 2017-12-08 DIAGNOSIS — Z86718 Personal history of other venous thrombosis and embolism: Secondary | ICD-10-CM | POA: Diagnosis not present

## 2017-12-08 DIAGNOSIS — R52 Pain, unspecified: Secondary | ICD-10-CM | POA: Diagnosis not present

## 2017-12-08 DIAGNOSIS — R600 Localized edema: Secondary | ICD-10-CM | POA: Diagnosis not present

## 2017-12-08 DIAGNOSIS — C672 Malignant neoplasm of lateral wall of bladder: Secondary | ICD-10-CM | POA: Diagnosis not present

## 2017-12-12 DIAGNOSIS — R52 Pain, unspecified: Secondary | ICD-10-CM | POA: Diagnosis not present

## 2017-12-12 DIAGNOSIS — R590 Localized enlarged lymph nodes: Secondary | ICD-10-CM | POA: Diagnosis not present

## 2017-12-12 DIAGNOSIS — C689 Malignant neoplasm of urinary organ, unspecified: Secondary | ICD-10-CM | POA: Diagnosis not present

## 2017-12-12 DIAGNOSIS — R600 Localized edema: Secondary | ICD-10-CM | POA: Diagnosis not present

## 2017-12-12 DIAGNOSIS — C672 Malignant neoplasm of lateral wall of bladder: Secondary | ICD-10-CM | POA: Diagnosis not present

## 2017-12-12 DIAGNOSIS — Z86718 Personal history of other venous thrombosis and embolism: Secondary | ICD-10-CM | POA: Diagnosis not present

## 2017-12-13 DIAGNOSIS — R590 Localized enlarged lymph nodes: Secondary | ICD-10-CM | POA: Diagnosis not present

## 2017-12-13 DIAGNOSIS — N5089 Other specified disorders of the male genital organs: Secondary | ICD-10-CM | POA: Diagnosis not present

## 2017-12-13 DIAGNOSIS — N1339 Other hydronephrosis: Secondary | ICD-10-CM | POA: Diagnosis not present

## 2017-12-13 DIAGNOSIS — R600 Localized edema: Secondary | ICD-10-CM | POA: Diagnosis not present

## 2017-12-13 DIAGNOSIS — R52 Pain, unspecified: Secondary | ICD-10-CM | POA: Diagnosis not present

## 2017-12-13 DIAGNOSIS — Z86718 Personal history of other venous thrombosis and embolism: Secondary | ICD-10-CM | POA: Diagnosis not present

## 2017-12-13 DIAGNOSIS — C672 Malignant neoplasm of lateral wall of bladder: Secondary | ICD-10-CM | POA: Diagnosis not present

## 2017-12-13 DIAGNOSIS — I82819 Embolism and thrombosis of superficial veins of unspecified lower extremities: Secondary | ICD-10-CM | POA: Diagnosis not present

## 2017-12-14 DIAGNOSIS — N1339 Other hydronephrosis: Secondary | ICD-10-CM | POA: Diagnosis not present

## 2017-12-14 DIAGNOSIS — R600 Localized edema: Secondary | ICD-10-CM | POA: Diagnosis not present

## 2017-12-14 DIAGNOSIS — Z86718 Personal history of other venous thrombosis and embolism: Secondary | ICD-10-CM | POA: Diagnosis not present

## 2017-12-14 DIAGNOSIS — C672 Malignant neoplasm of lateral wall of bladder: Secondary | ICD-10-CM | POA: Diagnosis not present

## 2017-12-14 DIAGNOSIS — R52 Pain, unspecified: Secondary | ICD-10-CM | POA: Diagnosis not present

## 2017-12-15 DIAGNOSIS — C672 Malignant neoplasm of lateral wall of bladder: Secondary | ICD-10-CM | POA: Diagnosis not present

## 2017-12-15 DIAGNOSIS — R600 Localized edema: Secondary | ICD-10-CM | POA: Diagnosis not present

## 2017-12-15 DIAGNOSIS — C770 Secondary and unspecified malignant neoplasm of lymph nodes of head, face and neck: Secondary | ICD-10-CM | POA: Diagnosis not present

## 2017-12-15 DIAGNOSIS — J90 Pleural effusion, not elsewhere classified: Secondary | ICD-10-CM | POA: Diagnosis not present

## 2017-12-15 DIAGNOSIS — R52 Pain, unspecified: Secondary | ICD-10-CM | POA: Diagnosis not present

## 2017-12-15 DIAGNOSIS — Z86718 Personal history of other venous thrombosis and embolism: Secondary | ICD-10-CM | POA: Diagnosis not present

## 2017-12-19 HISTORY — PX: OTHER SURGICAL HISTORY: SHX169

## 2017-12-20 DIAGNOSIS — I82401 Acute embolism and thrombosis of unspecified deep veins of right lower extremity: Secondary | ICD-10-CM | POA: Diagnosis not present

## 2017-12-20 DIAGNOSIS — N1339 Other hydronephrosis: Secondary | ICD-10-CM | POA: Diagnosis not present

## 2017-12-20 DIAGNOSIS — M7989 Other specified soft tissue disorders: Secondary | ICD-10-CM | POA: Diagnosis not present

## 2017-12-20 DIAGNOSIS — R590 Localized enlarged lymph nodes: Secondary | ICD-10-CM | POA: Diagnosis not present

## 2017-12-20 DIAGNOSIS — C670 Malignant neoplasm of trigone of bladder: Secondary | ICD-10-CM | POA: Diagnosis not present

## 2017-12-20 DIAGNOSIS — C672 Malignant neoplasm of lateral wall of bladder: Secondary | ICD-10-CM | POA: Diagnosis not present

## 2017-12-26 DIAGNOSIS — C778 Secondary and unspecified malignant neoplasm of lymph nodes of multiple regions: Secondary | ICD-10-CM | POA: Diagnosis not present

## 2017-12-26 DIAGNOSIS — C689 Malignant neoplasm of urinary organ, unspecified: Secondary | ICD-10-CM | POA: Diagnosis not present

## 2017-12-28 DIAGNOSIS — Z006 Encounter for examination for normal comparison and control in clinical research program: Secondary | ICD-10-CM | POA: Diagnosis not present

## 2017-12-28 DIAGNOSIS — C679 Malignant neoplasm of bladder, unspecified: Secondary | ICD-10-CM | POA: Diagnosis not present

## 2017-12-28 DIAGNOSIS — R51 Headache: Secondary | ICD-10-CM | POA: Diagnosis not present

## 2017-12-28 DIAGNOSIS — C778 Secondary and unspecified malignant neoplasm of lymph nodes of multiple regions: Secondary | ICD-10-CM | POA: Diagnosis not present

## 2017-12-29 DIAGNOSIS — C679 Malignant neoplasm of bladder, unspecified: Secondary | ICD-10-CM | POA: Diagnosis not present

## 2017-12-29 DIAGNOSIS — I82401 Acute embolism and thrombosis of unspecified deep veins of right lower extremity: Secondary | ICD-10-CM | POA: Diagnosis not present

## 2017-12-29 DIAGNOSIS — C775 Secondary and unspecified malignant neoplasm of intrapelvic lymph nodes: Secondary | ICD-10-CM | POA: Diagnosis not present

## 2017-12-29 DIAGNOSIS — R194 Change in bowel habit: Secondary | ICD-10-CM | POA: Diagnosis not present

## 2018-01-12 DIAGNOSIS — E876 Hypokalemia: Secondary | ICD-10-CM | POA: Diagnosis not present

## 2018-01-12 DIAGNOSIS — Z86718 Personal history of other venous thrombosis and embolism: Secondary | ICD-10-CM | POA: Diagnosis not present

## 2018-01-12 DIAGNOSIS — Z5112 Encounter for antineoplastic immunotherapy: Secondary | ICD-10-CM | POA: Diagnosis not present

## 2018-01-12 DIAGNOSIS — C689 Malignant neoplasm of urinary organ, unspecified: Secondary | ICD-10-CM | POA: Diagnosis not present

## 2018-01-12 DIAGNOSIS — Z8052 Family history of malignant neoplasm of bladder: Secondary | ICD-10-CM | POA: Diagnosis not present

## 2018-01-12 DIAGNOSIS — Z574 Occupational exposure to toxic agents in agriculture: Secondary | ICD-10-CM | POA: Diagnosis not present

## 2018-01-12 DIAGNOSIS — Z5111 Encounter for antineoplastic chemotherapy: Secondary | ICD-10-CM | POA: Diagnosis not present

## 2018-01-12 DIAGNOSIS — C778 Secondary and unspecified malignant neoplasm of lymph nodes of multiple regions: Secondary | ICD-10-CM | POA: Diagnosis not present

## 2018-01-12 DIAGNOSIS — Z87891 Personal history of nicotine dependence: Secondary | ICD-10-CM | POA: Diagnosis not present

## 2018-01-12 DIAGNOSIS — Z006 Encounter for examination for normal comparison and control in clinical research program: Secondary | ICD-10-CM | POA: Diagnosis not present

## 2018-01-12 DIAGNOSIS — C770 Secondary and unspecified malignant neoplasm of lymph nodes of head, face and neck: Secondary | ICD-10-CM | POA: Diagnosis not present

## 2018-01-12 DIAGNOSIS — Z960 Presence of urogenital implants: Secondary | ICD-10-CM | POA: Diagnosis not present

## 2018-01-12 DIAGNOSIS — R600 Localized edema: Secondary | ICD-10-CM | POA: Diagnosis not present

## 2018-01-12 DIAGNOSIS — C679 Malignant neoplasm of bladder, unspecified: Secondary | ICD-10-CM | POA: Diagnosis not present

## 2018-01-12 DIAGNOSIS — Z7901 Long term (current) use of anticoagulants: Secondary | ICD-10-CM | POA: Diagnosis not present

## 2018-01-15 DIAGNOSIS — C689 Malignant neoplasm of urinary organ, unspecified: Secondary | ICD-10-CM | POA: Diagnosis not present

## 2018-01-19 DIAGNOSIS — C778 Secondary and unspecified malignant neoplasm of lymph nodes of multiple regions: Secondary | ICD-10-CM | POA: Diagnosis not present

## 2018-01-19 DIAGNOSIS — Z960 Presence of urogenital implants: Secondary | ICD-10-CM | POA: Diagnosis not present

## 2018-01-19 DIAGNOSIS — C689 Malignant neoplasm of urinary organ, unspecified: Secondary | ICD-10-CM | POA: Diagnosis not present

## 2018-01-19 DIAGNOSIS — Z006 Encounter for examination for normal comparison and control in clinical research program: Secondary | ICD-10-CM | POA: Diagnosis not present

## 2018-01-19 DIAGNOSIS — Z7901 Long term (current) use of anticoagulants: Secondary | ICD-10-CM | POA: Diagnosis not present

## 2018-01-19 DIAGNOSIS — Z5111 Encounter for antineoplastic chemotherapy: Secondary | ICD-10-CM | POA: Diagnosis not present

## 2018-01-19 DIAGNOSIS — R600 Localized edema: Secondary | ICD-10-CM | POA: Diagnosis not present

## 2018-01-19 DIAGNOSIS — Z4682 Encounter for fitting and adjustment of non-vascular catheter: Secondary | ICD-10-CM | POA: Diagnosis not present

## 2018-01-19 DIAGNOSIS — Z87891 Personal history of nicotine dependence: Secondary | ICD-10-CM | POA: Diagnosis not present

## 2018-01-19 DIAGNOSIS — Z86718 Personal history of other venous thrombosis and embolism: Secondary | ICD-10-CM | POA: Diagnosis not present

## 2018-01-19 DIAGNOSIS — C679 Malignant neoplasm of bladder, unspecified: Secondary | ICD-10-CM | POA: Diagnosis not present

## 2018-01-19 DIAGNOSIS — E876 Hypokalemia: Secondary | ICD-10-CM | POA: Diagnosis not present

## 2018-01-19 DIAGNOSIS — C770 Secondary and unspecified malignant neoplasm of lymph nodes of head, face and neck: Secondary | ICD-10-CM | POA: Diagnosis not present

## 2018-02-02 DIAGNOSIS — C689 Malignant neoplasm of urinary organ, unspecified: Secondary | ICD-10-CM | POA: Diagnosis not present

## 2018-02-02 DIAGNOSIS — Z574 Occupational exposure to toxic agents in agriculture: Secondary | ICD-10-CM | POA: Diagnosis not present

## 2018-02-02 DIAGNOSIS — Z8052 Family history of malignant neoplasm of bladder: Secondary | ICD-10-CM | POA: Diagnosis not present

## 2018-02-02 DIAGNOSIS — C778 Secondary and unspecified malignant neoplasm of lymph nodes of multiple regions: Secondary | ICD-10-CM | POA: Diagnosis not present

## 2018-02-02 DIAGNOSIS — Z5112 Encounter for antineoplastic immunotherapy: Secondary | ICD-10-CM | POA: Diagnosis not present

## 2018-02-02 DIAGNOSIS — E876 Hypokalemia: Secondary | ICD-10-CM | POA: Diagnosis not present

## 2018-02-02 DIAGNOSIS — Z86718 Personal history of other venous thrombosis and embolism: Secondary | ICD-10-CM | POA: Diagnosis not present

## 2018-02-02 DIAGNOSIS — Z960 Presence of urogenital implants: Secondary | ICD-10-CM | POA: Diagnosis not present

## 2018-02-02 DIAGNOSIS — C679 Malignant neoplasm of bladder, unspecified: Secondary | ICD-10-CM | POA: Diagnosis not present

## 2018-02-02 DIAGNOSIS — Z87891 Personal history of nicotine dependence: Secondary | ICD-10-CM | POA: Diagnosis not present

## 2018-02-02 DIAGNOSIS — Z5111 Encounter for antineoplastic chemotherapy: Secondary | ICD-10-CM | POA: Diagnosis not present

## 2018-02-02 DIAGNOSIS — C770 Secondary and unspecified malignant neoplasm of lymph nodes of head, face and neck: Secondary | ICD-10-CM | POA: Diagnosis not present

## 2018-02-02 DIAGNOSIS — Z006 Encounter for examination for normal comparison and control in clinical research program: Secondary | ICD-10-CM | POA: Diagnosis not present

## 2018-02-02 DIAGNOSIS — Z7901 Long term (current) use of anticoagulants: Secondary | ICD-10-CM | POA: Diagnosis not present

## 2018-02-02 DIAGNOSIS — R600 Localized edema: Secondary | ICD-10-CM | POA: Diagnosis not present

## 2018-02-02 DIAGNOSIS — I808 Phlebitis and thrombophlebitis of other sites: Secondary | ICD-10-CM | POA: Diagnosis not present

## 2018-02-09 DIAGNOSIS — C679 Malignant neoplasm of bladder, unspecified: Secondary | ICD-10-CM | POA: Diagnosis not present

## 2018-02-09 DIAGNOSIS — I808 Phlebitis and thrombophlebitis of other sites: Secondary | ICD-10-CM | POA: Diagnosis not present

## 2018-02-09 DIAGNOSIS — Z5111 Encounter for antineoplastic chemotherapy: Secondary | ICD-10-CM | POA: Diagnosis not present

## 2018-02-09 DIAGNOSIS — E876 Hypokalemia: Secondary | ICD-10-CM | POA: Diagnosis not present

## 2018-02-09 DIAGNOSIS — Z960 Presence of urogenital implants: Secondary | ICD-10-CM | POA: Diagnosis not present

## 2018-02-09 DIAGNOSIS — I82491 Acute embolism and thrombosis of other specified deep vein of right lower extremity: Secondary | ICD-10-CM | POA: Diagnosis not present

## 2018-02-09 DIAGNOSIS — Z006 Encounter for examination for normal comparison and control in clinical research program: Secondary | ICD-10-CM | POA: Diagnosis not present

## 2018-02-09 DIAGNOSIS — Z7901 Long term (current) use of anticoagulants: Secondary | ICD-10-CM | POA: Diagnosis not present

## 2018-02-09 DIAGNOSIS — C778 Secondary and unspecified malignant neoplasm of lymph nodes of multiple regions: Secondary | ICD-10-CM | POA: Diagnosis not present

## 2018-02-09 DIAGNOSIS — C689 Malignant neoplasm of urinary organ, unspecified: Secondary | ICD-10-CM | POA: Diagnosis not present

## 2018-02-09 DIAGNOSIS — Z87891 Personal history of nicotine dependence: Secondary | ICD-10-CM | POA: Diagnosis not present

## 2018-02-09 DIAGNOSIS — R600 Localized edema: Secondary | ICD-10-CM | POA: Diagnosis not present

## 2018-02-09 DIAGNOSIS — C770 Secondary and unspecified malignant neoplasm of lymph nodes of head, face and neck: Secondary | ICD-10-CM | POA: Diagnosis not present

## 2018-02-23 DIAGNOSIS — Z5111 Encounter for antineoplastic chemotherapy: Secondary | ICD-10-CM | POA: Diagnosis not present

## 2018-02-23 DIAGNOSIS — R600 Localized edema: Secondary | ICD-10-CM | POA: Diagnosis not present

## 2018-02-23 DIAGNOSIS — R319 Hematuria, unspecified: Secondary | ICD-10-CM | POA: Diagnosis not present

## 2018-02-23 DIAGNOSIS — C689 Malignant neoplasm of urinary organ, unspecified: Secondary | ICD-10-CM | POA: Diagnosis not present

## 2018-02-23 DIAGNOSIS — Z5112 Encounter for antineoplastic immunotherapy: Secondary | ICD-10-CM | POA: Diagnosis not present

## 2018-02-23 DIAGNOSIS — Z87891 Personal history of nicotine dependence: Secondary | ICD-10-CM | POA: Diagnosis not present

## 2018-02-23 DIAGNOSIS — E876 Hypokalemia: Secondary | ICD-10-CM | POA: Diagnosis not present

## 2018-02-23 DIAGNOSIS — Z7901 Long term (current) use of anticoagulants: Secondary | ICD-10-CM | POA: Diagnosis not present

## 2018-02-23 DIAGNOSIS — C778 Secondary and unspecified malignant neoplasm of lymph nodes of multiple regions: Secondary | ICD-10-CM | POA: Diagnosis not present

## 2018-02-23 DIAGNOSIS — C679 Malignant neoplasm of bladder, unspecified: Secondary | ICD-10-CM | POA: Diagnosis not present

## 2018-02-23 DIAGNOSIS — I824Z1 Acute embolism and thrombosis of unspecified deep veins of right distal lower extremity: Secondary | ICD-10-CM | POA: Diagnosis not present

## 2018-02-23 DIAGNOSIS — C770 Secondary and unspecified malignant neoplasm of lymph nodes of head, face and neck: Secondary | ICD-10-CM | POA: Diagnosis not present

## 2018-02-23 DIAGNOSIS — Z006 Encounter for examination for normal comparison and control in clinical research program: Secondary | ICD-10-CM | POA: Diagnosis not present

## 2018-03-02 DIAGNOSIS — Z5111 Encounter for antineoplastic chemotherapy: Secondary | ICD-10-CM | POA: Diagnosis not present

## 2018-03-02 DIAGNOSIS — Z86718 Personal history of other venous thrombosis and embolism: Secondary | ICD-10-CM | POA: Diagnosis not present

## 2018-03-02 DIAGNOSIS — E876 Hypokalemia: Secondary | ICD-10-CM | POA: Diagnosis not present

## 2018-03-02 DIAGNOSIS — Z79899 Other long term (current) drug therapy: Secondary | ICD-10-CM | POA: Diagnosis not present

## 2018-03-02 DIAGNOSIS — Z436 Encounter for attention to other artificial openings of urinary tract: Secondary | ICD-10-CM | POA: Diagnosis not present

## 2018-03-02 DIAGNOSIS — Z960 Presence of urogenital implants: Secondary | ICD-10-CM | POA: Diagnosis not present

## 2018-03-02 DIAGNOSIS — Z7901 Long term (current) use of anticoagulants: Secondary | ICD-10-CM | POA: Diagnosis not present

## 2018-03-02 DIAGNOSIS — C689 Malignant neoplasm of urinary organ, unspecified: Secondary | ICD-10-CM | POA: Diagnosis not present

## 2018-03-02 DIAGNOSIS — Z006 Encounter for examination for normal comparison and control in clinical research program: Secondary | ICD-10-CM | POA: Diagnosis not present

## 2018-03-02 DIAGNOSIS — C675 Malignant neoplasm of bladder neck: Secondary | ICD-10-CM | POA: Diagnosis not present

## 2018-03-02 DIAGNOSIS — R600 Localized edema: Secondary | ICD-10-CM | POA: Diagnosis not present

## 2018-03-02 DIAGNOSIS — C778 Secondary and unspecified malignant neoplasm of lymph nodes of multiple regions: Secondary | ICD-10-CM | POA: Diagnosis not present

## 2018-03-02 DIAGNOSIS — Z87891 Personal history of nicotine dependence: Secondary | ICD-10-CM | POA: Diagnosis not present

## 2018-03-06 DIAGNOSIS — J9811 Atelectasis: Secondary | ICD-10-CM | POA: Diagnosis not present

## 2018-03-06 DIAGNOSIS — Z86718 Personal history of other venous thrombosis and embolism: Secondary | ICD-10-CM | POA: Diagnosis not present

## 2018-03-06 DIAGNOSIS — C689 Malignant neoplasm of urinary organ, unspecified: Secondary | ICD-10-CM | POA: Diagnosis not present

## 2018-03-06 DIAGNOSIS — Z936 Other artificial openings of urinary tract status: Secondary | ICD-10-CM | POA: Diagnosis not present

## 2018-03-06 DIAGNOSIS — R200 Anesthesia of skin: Secondary | ICD-10-CM | POA: Diagnosis not present

## 2018-03-06 DIAGNOSIS — Z7901 Long term (current) use of anticoagulants: Secondary | ICD-10-CM | POA: Diagnosis not present

## 2018-03-06 DIAGNOSIS — D649 Anemia, unspecified: Secondary | ICD-10-CM | POA: Diagnosis not present

## 2018-03-06 DIAGNOSIS — R601 Generalized edema: Secondary | ICD-10-CM | POA: Diagnosis not present

## 2018-03-06 DIAGNOSIS — Z87891 Personal history of nicotine dependence: Secondary | ICD-10-CM | POA: Diagnosis not present

## 2018-03-06 DIAGNOSIS — C778 Secondary and unspecified malignant neoplasm of lymph nodes of multiple regions: Secondary | ICD-10-CM | POA: Diagnosis not present

## 2018-03-06 DIAGNOSIS — C679 Malignant neoplasm of bladder, unspecified: Secondary | ICD-10-CM | POA: Diagnosis not present

## 2018-03-16 DIAGNOSIS — Z006 Encounter for examination for normal comparison and control in clinical research program: Secondary | ICD-10-CM | POA: Diagnosis not present

## 2018-03-16 DIAGNOSIS — Z936 Other artificial openings of urinary tract status: Secondary | ICD-10-CM | POA: Diagnosis not present

## 2018-03-16 DIAGNOSIS — C689 Malignant neoplasm of urinary organ, unspecified: Secondary | ICD-10-CM | POA: Diagnosis not present

## 2018-03-16 DIAGNOSIS — Z5111 Encounter for antineoplastic chemotherapy: Secondary | ICD-10-CM | POA: Diagnosis not present

## 2018-03-16 DIAGNOSIS — D649 Anemia, unspecified: Secondary | ICD-10-CM | POA: Diagnosis not present

## 2018-03-16 DIAGNOSIS — I82401 Acute embolism and thrombosis of unspecified deep veins of right lower extremity: Secondary | ICD-10-CM | POA: Diagnosis not present

## 2018-03-16 DIAGNOSIS — C679 Malignant neoplasm of bladder, unspecified: Secondary | ICD-10-CM | POA: Diagnosis not present

## 2018-03-16 DIAGNOSIS — Z87891 Personal history of nicotine dependence: Secondary | ICD-10-CM | POA: Diagnosis not present

## 2018-03-16 DIAGNOSIS — C778 Secondary and unspecified malignant neoplasm of lymph nodes of multiple regions: Secondary | ICD-10-CM | POA: Diagnosis not present

## 2018-03-16 DIAGNOSIS — Z7901 Long term (current) use of anticoagulants: Secondary | ICD-10-CM | POA: Diagnosis not present

## 2018-03-23 DIAGNOSIS — Z7901 Long term (current) use of anticoagulants: Secondary | ICD-10-CM | POA: Diagnosis not present

## 2018-03-23 DIAGNOSIS — C778 Secondary and unspecified malignant neoplasm of lymph nodes of multiple regions: Secondary | ICD-10-CM | POA: Diagnosis not present

## 2018-03-23 DIAGNOSIS — I82401 Acute embolism and thrombosis of unspecified deep veins of right lower extremity: Secondary | ICD-10-CM | POA: Diagnosis not present

## 2018-03-23 DIAGNOSIS — Z5111 Encounter for antineoplastic chemotherapy: Secondary | ICD-10-CM | POA: Diagnosis not present

## 2018-03-23 DIAGNOSIS — C689 Malignant neoplasm of urinary organ, unspecified: Secondary | ICD-10-CM | POA: Diagnosis not present

## 2018-03-23 DIAGNOSIS — C679 Malignant neoplasm of bladder, unspecified: Secondary | ICD-10-CM | POA: Diagnosis not present

## 2018-03-23 DIAGNOSIS — R600 Localized edema: Secondary | ICD-10-CM | POA: Diagnosis not present

## 2018-03-23 DIAGNOSIS — Z936 Other artificial openings of urinary tract status: Secondary | ICD-10-CM | POA: Diagnosis not present

## 2018-03-23 DIAGNOSIS — Z87891 Personal history of nicotine dependence: Secondary | ICD-10-CM | POA: Diagnosis not present

## 2018-03-23 DIAGNOSIS — Z006 Encounter for examination for normal comparison and control in clinical research program: Secondary | ICD-10-CM | POA: Diagnosis not present

## 2018-03-23 DIAGNOSIS — D649 Anemia, unspecified: Secondary | ICD-10-CM | POA: Diagnosis not present

## 2018-04-06 DIAGNOSIS — Z5111 Encounter for antineoplastic chemotherapy: Secondary | ICD-10-CM | POA: Diagnosis not present

## 2018-04-06 DIAGNOSIS — Z574 Occupational exposure to toxic agents in agriculture: Secondary | ICD-10-CM | POA: Diagnosis not present

## 2018-04-06 DIAGNOSIS — Z5112 Encounter for antineoplastic immunotherapy: Secondary | ICD-10-CM | POA: Diagnosis not present

## 2018-04-06 DIAGNOSIS — Z8052 Family history of malignant neoplasm of bladder: Secondary | ICD-10-CM | POA: Diagnosis not present

## 2018-04-06 DIAGNOSIS — Z86718 Personal history of other venous thrombosis and embolism: Secondary | ICD-10-CM | POA: Diagnosis not present

## 2018-04-06 DIAGNOSIS — Z936 Other artificial openings of urinary tract status: Secondary | ICD-10-CM | POA: Diagnosis not present

## 2018-04-06 DIAGNOSIS — C778 Secondary and unspecified malignant neoplasm of lymph nodes of multiple regions: Secondary | ICD-10-CM | POA: Diagnosis not present

## 2018-04-06 DIAGNOSIS — R600 Localized edema: Secondary | ICD-10-CM | POA: Diagnosis not present

## 2018-04-06 DIAGNOSIS — Z006 Encounter for examination for normal comparison and control in clinical research program: Secondary | ICD-10-CM | POA: Diagnosis not present

## 2018-04-06 DIAGNOSIS — D649 Anemia, unspecified: Secondary | ICD-10-CM | POA: Diagnosis not present

## 2018-04-06 DIAGNOSIS — Z7901 Long term (current) use of anticoagulants: Secondary | ICD-10-CM | POA: Diagnosis not present

## 2018-04-06 DIAGNOSIS — C689 Malignant neoplasm of urinary organ, unspecified: Secondary | ICD-10-CM | POA: Diagnosis not present

## 2018-04-06 DIAGNOSIS — Z87891 Personal history of nicotine dependence: Secondary | ICD-10-CM | POA: Diagnosis not present

## 2018-04-13 DIAGNOSIS — C689 Malignant neoplasm of urinary organ, unspecified: Secondary | ICD-10-CM | POA: Diagnosis not present

## 2018-04-13 DIAGNOSIS — C778 Secondary and unspecified malignant neoplasm of lymph nodes of multiple regions: Secondary | ICD-10-CM | POA: Diagnosis not present

## 2018-04-13 DIAGNOSIS — Z86718 Personal history of other venous thrombosis and embolism: Secondary | ICD-10-CM | POA: Diagnosis not present

## 2018-04-13 DIAGNOSIS — R600 Localized edema: Secondary | ICD-10-CM | POA: Diagnosis not present

## 2018-04-13 DIAGNOSIS — Z436 Encounter for attention to other artificial openings of urinary tract: Secondary | ICD-10-CM | POA: Diagnosis not present

## 2018-04-13 DIAGNOSIS — Z5111 Encounter for antineoplastic chemotherapy: Secondary | ICD-10-CM | POA: Diagnosis not present

## 2018-04-13 DIAGNOSIS — Z006 Encounter for examination for normal comparison and control in clinical research program: Secondary | ICD-10-CM | POA: Diagnosis not present

## 2018-04-13 DIAGNOSIS — C674 Malignant neoplasm of posterior wall of bladder: Secondary | ICD-10-CM | POA: Diagnosis not present

## 2018-04-13 DIAGNOSIS — Z7901 Long term (current) use of anticoagulants: Secondary | ICD-10-CM | POA: Diagnosis not present

## 2018-04-13 DIAGNOSIS — D630 Anemia in neoplastic disease: Secondary | ICD-10-CM | POA: Diagnosis not present

## 2018-04-27 DIAGNOSIS — Z5112 Encounter for antineoplastic immunotherapy: Secondary | ICD-10-CM | POA: Diagnosis not present

## 2018-04-27 DIAGNOSIS — Z86718 Personal history of other venous thrombosis and embolism: Secondary | ICD-10-CM | POA: Diagnosis not present

## 2018-04-27 DIAGNOSIS — Z87891 Personal history of nicotine dependence: Secondary | ICD-10-CM | POA: Diagnosis not present

## 2018-04-27 DIAGNOSIS — Z960 Presence of urogenital implants: Secondary | ICD-10-CM | POA: Diagnosis not present

## 2018-04-27 DIAGNOSIS — Z936 Other artificial openings of urinary tract status: Secondary | ICD-10-CM | POA: Diagnosis not present

## 2018-04-27 DIAGNOSIS — Z5111 Encounter for antineoplastic chemotherapy: Secondary | ICD-10-CM | POA: Diagnosis not present

## 2018-04-27 DIAGNOSIS — D649 Anemia, unspecified: Secondary | ICD-10-CM | POA: Diagnosis not present

## 2018-04-27 DIAGNOSIS — C679 Malignant neoplasm of bladder, unspecified: Secondary | ICD-10-CM | POA: Diagnosis not present

## 2018-04-27 DIAGNOSIS — C778 Secondary and unspecified malignant neoplasm of lymph nodes of multiple regions: Secondary | ICD-10-CM | POA: Diagnosis not present

## 2018-04-27 DIAGNOSIS — Z8052 Family history of malignant neoplasm of bladder: Secondary | ICD-10-CM | POA: Diagnosis not present

## 2018-04-27 DIAGNOSIS — Z7901 Long term (current) use of anticoagulants: Secondary | ICD-10-CM | POA: Diagnosis not present

## 2018-04-27 DIAGNOSIS — Z006 Encounter for examination for normal comparison and control in clinical research program: Secondary | ICD-10-CM | POA: Diagnosis not present

## 2018-04-27 DIAGNOSIS — C689 Malignant neoplasm of urinary organ, unspecified: Secondary | ICD-10-CM | POA: Diagnosis not present

## 2018-04-27 DIAGNOSIS — Z77098 Contact with and (suspected) exposure to other hazardous, chiefly nonmedicinal, chemicals: Secondary | ICD-10-CM | POA: Diagnosis not present

## 2018-04-27 DIAGNOSIS — R350 Frequency of micturition: Secondary | ICD-10-CM | POA: Diagnosis not present

## 2018-04-27 DIAGNOSIS — R600 Localized edema: Secondary | ICD-10-CM | POA: Diagnosis not present

## 2018-05-04 DIAGNOSIS — C778 Secondary and unspecified malignant neoplasm of lymph nodes of multiple regions: Secondary | ICD-10-CM | POA: Diagnosis not present

## 2018-05-04 DIAGNOSIS — C679 Malignant neoplasm of bladder, unspecified: Secondary | ICD-10-CM | POA: Diagnosis not present

## 2018-05-04 DIAGNOSIS — Z86718 Personal history of other venous thrombosis and embolism: Secondary | ICD-10-CM | POA: Diagnosis not present

## 2018-05-04 DIAGNOSIS — Z006 Encounter for examination for normal comparison and control in clinical research program: Secondary | ICD-10-CM | POA: Diagnosis not present

## 2018-05-04 DIAGNOSIS — D649 Anemia, unspecified: Secondary | ICD-10-CM | POA: Diagnosis not present

## 2018-05-04 DIAGNOSIS — R600 Localized edema: Secondary | ICD-10-CM | POA: Diagnosis not present

## 2018-05-04 DIAGNOSIS — Z5111 Encounter for antineoplastic chemotherapy: Secondary | ICD-10-CM | POA: Diagnosis not present

## 2018-05-04 DIAGNOSIS — Z87891 Personal history of nicotine dependence: Secondary | ICD-10-CM | POA: Diagnosis not present

## 2018-05-04 DIAGNOSIS — C689 Malignant neoplasm of urinary organ, unspecified: Secondary | ICD-10-CM | POA: Diagnosis not present

## 2018-05-04 DIAGNOSIS — C680 Malignant neoplasm of urethra: Secondary | ICD-10-CM | POA: Diagnosis not present

## 2018-05-04 DIAGNOSIS — Z7901 Long term (current) use of anticoagulants: Secondary | ICD-10-CM | POA: Diagnosis not present

## 2018-05-10 DIAGNOSIS — R600 Localized edema: Secondary | ICD-10-CM | POA: Diagnosis not present

## 2018-05-10 DIAGNOSIS — Z86718 Personal history of other venous thrombosis and embolism: Secondary | ICD-10-CM | POA: Diagnosis not present

## 2018-05-10 DIAGNOSIS — Z7901 Long term (current) use of anticoagulants: Secondary | ICD-10-CM | POA: Diagnosis not present

## 2018-05-10 DIAGNOSIS — C778 Secondary and unspecified malignant neoplasm of lymph nodes of multiple regions: Secondary | ICD-10-CM | POA: Diagnosis not present

## 2018-05-10 DIAGNOSIS — Z006 Encounter for examination for normal comparison and control in clinical research program: Secondary | ICD-10-CM | POA: Diagnosis not present

## 2018-05-10 DIAGNOSIS — C689 Malignant neoplasm of urinary organ, unspecified: Secondary | ICD-10-CM | POA: Diagnosis not present

## 2018-05-10 DIAGNOSIS — Z87891 Personal history of nicotine dependence: Secondary | ICD-10-CM | POA: Diagnosis not present

## 2018-05-10 DIAGNOSIS — C679 Malignant neoplasm of bladder, unspecified: Secondary | ICD-10-CM | POA: Diagnosis not present

## 2018-05-10 DIAGNOSIS — D649 Anemia, unspecified: Secondary | ICD-10-CM | POA: Diagnosis not present

## 2018-05-18 DIAGNOSIS — C778 Secondary and unspecified malignant neoplasm of lymph nodes of multiple regions: Secondary | ICD-10-CM | POA: Diagnosis not present

## 2018-05-18 DIAGNOSIS — Z87891 Personal history of nicotine dependence: Secondary | ICD-10-CM | POA: Diagnosis not present

## 2018-05-18 DIAGNOSIS — I82501 Chronic embolism and thrombosis of unspecified deep veins of right lower extremity: Secondary | ICD-10-CM | POA: Diagnosis not present

## 2018-05-18 DIAGNOSIS — Z466 Encounter for fitting and adjustment of urinary device: Secondary | ICD-10-CM | POA: Diagnosis not present

## 2018-05-18 DIAGNOSIS — Z5112 Encounter for antineoplastic immunotherapy: Secondary | ICD-10-CM | POA: Diagnosis not present

## 2018-05-18 DIAGNOSIS — C679 Malignant neoplasm of bladder, unspecified: Secondary | ICD-10-CM | POA: Diagnosis not present

## 2018-05-18 DIAGNOSIS — Z436 Encounter for attention to other artificial openings of urinary tract: Secondary | ICD-10-CM | POA: Diagnosis not present

## 2018-05-18 DIAGNOSIS — D649 Anemia, unspecified: Secondary | ICD-10-CM | POA: Diagnosis not present

## 2018-05-18 DIAGNOSIS — C689 Malignant neoplasm of urinary organ, unspecified: Secondary | ICD-10-CM | POA: Diagnosis not present

## 2018-05-18 DIAGNOSIS — Z006 Encounter for examination for normal comparison and control in clinical research program: Secondary | ICD-10-CM | POA: Diagnosis not present

## 2018-05-18 DIAGNOSIS — Z79899 Other long term (current) drug therapy: Secondary | ICD-10-CM | POA: Diagnosis not present

## 2018-05-18 DIAGNOSIS — R600 Localized edema: Secondary | ICD-10-CM | POA: Diagnosis not present

## 2018-05-18 DIAGNOSIS — Z7901 Long term (current) use of anticoagulants: Secondary | ICD-10-CM | POA: Diagnosis not present

## 2018-06-15 DIAGNOSIS — Z006 Encounter for examination for normal comparison and control in clinical research program: Secondary | ICD-10-CM | POA: Diagnosis not present

## 2018-06-15 DIAGNOSIS — C778 Secondary and unspecified malignant neoplasm of lymph nodes of multiple regions: Secondary | ICD-10-CM | POA: Diagnosis not present

## 2018-06-15 DIAGNOSIS — Z79899 Other long term (current) drug therapy: Secondary | ICD-10-CM | POA: Diagnosis not present

## 2018-06-15 DIAGNOSIS — Z5112 Encounter for antineoplastic immunotherapy: Secondary | ICD-10-CM | POA: Diagnosis not present

## 2018-06-22 DIAGNOSIS — C778 Secondary and unspecified malignant neoplasm of lymph nodes of multiple regions: Secondary | ICD-10-CM | POA: Diagnosis not present

## 2018-07-12 DIAGNOSIS — Z79899 Other long term (current) drug therapy: Secondary | ICD-10-CM | POA: Diagnosis not present

## 2018-07-12 DIAGNOSIS — Z006 Encounter for examination for normal comparison and control in clinical research program: Secondary | ICD-10-CM | POA: Diagnosis not present

## 2018-07-12 DIAGNOSIS — E039 Hypothyroidism, unspecified: Secondary | ICD-10-CM | POA: Diagnosis not present

## 2018-07-12 DIAGNOSIS — D649 Anemia, unspecified: Secondary | ICD-10-CM | POA: Diagnosis not present

## 2018-07-12 DIAGNOSIS — Z7902 Long term (current) use of antithrombotics/antiplatelets: Secondary | ICD-10-CM | POA: Diagnosis not present

## 2018-07-12 DIAGNOSIS — C679 Malignant neoplasm of bladder, unspecified: Secondary | ICD-10-CM | POA: Diagnosis not present

## 2018-07-12 DIAGNOSIS — Z86718 Personal history of other venous thrombosis and embolism: Secondary | ICD-10-CM | POA: Diagnosis not present

## 2018-07-12 DIAGNOSIS — Z5112 Encounter for antineoplastic immunotherapy: Secondary | ICD-10-CM | POA: Diagnosis not present

## 2018-07-12 DIAGNOSIS — R600 Localized edema: Secondary | ICD-10-CM | POA: Diagnosis not present

## 2018-07-12 DIAGNOSIS — Z87891 Personal history of nicotine dependence: Secondary | ICD-10-CM | POA: Diagnosis not present

## 2018-07-12 DIAGNOSIS — C778 Secondary and unspecified malignant neoplasm of lymph nodes of multiple regions: Secondary | ICD-10-CM | POA: Diagnosis not present

## 2018-07-12 DIAGNOSIS — N134 Hydroureter: Secondary | ICD-10-CM | POA: Diagnosis not present

## 2018-08-10 DIAGNOSIS — C679 Malignant neoplasm of bladder, unspecified: Secondary | ICD-10-CM | POA: Diagnosis not present

## 2018-08-10 DIAGNOSIS — E039 Hypothyroidism, unspecified: Secondary | ICD-10-CM | POA: Diagnosis not present

## 2018-08-10 DIAGNOSIS — D630 Anemia in neoplastic disease: Secondary | ICD-10-CM | POA: Diagnosis not present

## 2018-08-10 DIAGNOSIS — R600 Localized edema: Secondary | ICD-10-CM | POA: Diagnosis not present

## 2018-08-10 DIAGNOSIS — Z87891 Personal history of nicotine dependence: Secondary | ICD-10-CM | POA: Diagnosis not present

## 2018-08-10 DIAGNOSIS — Z5112 Encounter for antineoplastic immunotherapy: Secondary | ICD-10-CM | POA: Diagnosis not present

## 2018-08-10 DIAGNOSIS — C689 Malignant neoplasm of urinary organ, unspecified: Secondary | ICD-10-CM | POA: Diagnosis not present

## 2018-08-10 DIAGNOSIS — Z7902 Long term (current) use of antithrombotics/antiplatelets: Secondary | ICD-10-CM | POA: Diagnosis not present

## 2018-08-10 DIAGNOSIS — Z86718 Personal history of other venous thrombosis and embolism: Secondary | ICD-10-CM | POA: Diagnosis not present

## 2018-08-10 DIAGNOSIS — C778 Secondary and unspecified malignant neoplasm of lymph nodes of multiple regions: Secondary | ICD-10-CM | POA: Diagnosis not present

## 2018-08-10 DIAGNOSIS — Z79899 Other long term (current) drug therapy: Secondary | ICD-10-CM | POA: Diagnosis not present

## 2018-08-10 DIAGNOSIS — Z006 Encounter for examination for normal comparison and control in clinical research program: Secondary | ICD-10-CM | POA: Diagnosis not present

## 2018-08-10 DIAGNOSIS — N134 Hydroureter: Secondary | ICD-10-CM | POA: Diagnosis not present

## 2018-09-06 DIAGNOSIS — N3289 Other specified disorders of bladder: Secondary | ICD-10-CM | POA: Diagnosis not present

## 2018-09-06 DIAGNOSIS — C679 Malignant neoplasm of bladder, unspecified: Secondary | ICD-10-CM | POA: Diagnosis not present

## 2018-09-06 DIAGNOSIS — Z87891 Personal history of nicotine dependence: Secondary | ICD-10-CM | POA: Diagnosis not present

## 2018-09-06 DIAGNOSIS — C778 Secondary and unspecified malignant neoplasm of lymph nodes of multiple regions: Secondary | ICD-10-CM | POA: Diagnosis not present

## 2018-09-06 DIAGNOSIS — Z79890 Hormone replacement therapy (postmenopausal): Secondary | ICD-10-CM | POA: Diagnosis not present

## 2018-09-06 DIAGNOSIS — R21 Rash and other nonspecific skin eruption: Secondary | ICD-10-CM | POA: Diagnosis not present

## 2018-09-06 DIAGNOSIS — Z5112 Encounter for antineoplastic immunotherapy: Secondary | ICD-10-CM | POA: Diagnosis not present

## 2018-09-06 DIAGNOSIS — Z006 Encounter for examination for normal comparison and control in clinical research program: Secondary | ICD-10-CM | POA: Diagnosis not present

## 2018-09-06 DIAGNOSIS — Z936 Other artificial openings of urinary tract status: Secondary | ICD-10-CM | POA: Diagnosis not present

## 2018-09-06 DIAGNOSIS — R600 Localized edema: Secondary | ICD-10-CM | POA: Diagnosis not present

## 2018-09-06 DIAGNOSIS — Z7902 Long term (current) use of antithrombotics/antiplatelets: Secondary | ICD-10-CM | POA: Diagnosis not present

## 2018-09-06 DIAGNOSIS — Z86718 Personal history of other venous thrombosis and embolism: Secondary | ICD-10-CM | POA: Diagnosis not present

## 2018-09-06 DIAGNOSIS — D649 Anemia, unspecified: Secondary | ICD-10-CM | POA: Diagnosis not present

## 2018-09-06 DIAGNOSIS — E039 Hypothyroidism, unspecified: Secondary | ICD-10-CM | POA: Diagnosis not present

## 2018-09-12 DIAGNOSIS — C680 Malignant neoplasm of urethra: Secondary | ICD-10-CM | POA: Diagnosis not present

## 2018-09-12 DIAGNOSIS — Z79890 Hormone replacement therapy (postmenopausal): Secondary | ICD-10-CM | POA: Diagnosis not present

## 2018-09-12 DIAGNOSIS — C778 Secondary and unspecified malignant neoplasm of lymph nodes of multiple regions: Secondary | ICD-10-CM | POA: Diagnosis not present

## 2018-09-12 DIAGNOSIS — Z5181 Encounter for therapeutic drug level monitoring: Secondary | ICD-10-CM | POA: Diagnosis not present

## 2018-09-12 DIAGNOSIS — E039 Hypothyroidism, unspecified: Secondary | ICD-10-CM | POA: Diagnosis not present

## 2018-09-12 DIAGNOSIS — Z87891 Personal history of nicotine dependence: Secondary | ICD-10-CM | POA: Diagnosis not present

## 2018-10-05 DIAGNOSIS — C778 Secondary and unspecified malignant neoplasm of lymph nodes of multiple regions: Secondary | ICD-10-CM | POA: Diagnosis not present

## 2018-10-05 DIAGNOSIS — Z79899 Other long term (current) drug therapy: Secondary | ICD-10-CM | POA: Diagnosis not present

## 2018-10-05 DIAGNOSIS — C689 Malignant neoplasm of urinary organ, unspecified: Secondary | ICD-10-CM | POA: Diagnosis not present

## 2018-10-05 DIAGNOSIS — Z006 Encounter for examination for normal comparison and control in clinical research program: Secondary | ICD-10-CM | POA: Diagnosis not present

## 2018-10-17 DIAGNOSIS — E039 Hypothyroidism, unspecified: Secondary | ICD-10-CM | POA: Diagnosis not present

## 2018-11-01 DIAGNOSIS — Z86718 Personal history of other venous thrombosis and embolism: Secondary | ICD-10-CM | POA: Diagnosis not present

## 2018-11-01 DIAGNOSIS — C689 Malignant neoplasm of urinary organ, unspecified: Secondary | ICD-10-CM | POA: Diagnosis not present

## 2018-11-01 DIAGNOSIS — Z7901 Long term (current) use of anticoagulants: Secondary | ICD-10-CM | POA: Diagnosis not present

## 2018-11-01 DIAGNOSIS — C679 Malignant neoplasm of bladder, unspecified: Secondary | ICD-10-CM | POA: Diagnosis not present

## 2018-11-01 DIAGNOSIS — Z960 Presence of urogenital implants: Secondary | ICD-10-CM | POA: Diagnosis not present

## 2018-11-01 DIAGNOSIS — Z574 Occupational exposure to toxic agents in agriculture: Secondary | ICD-10-CM | POA: Diagnosis not present

## 2018-11-01 DIAGNOSIS — D649 Anemia, unspecified: Secondary | ICD-10-CM | POA: Diagnosis not present

## 2018-11-01 DIAGNOSIS — Z79899 Other long term (current) drug therapy: Secondary | ICD-10-CM | POA: Diagnosis not present

## 2018-11-01 DIAGNOSIS — Z006 Encounter for examination for normal comparison and control in clinical research program: Secondary | ICD-10-CM | POA: Diagnosis not present

## 2018-11-01 DIAGNOSIS — R9389 Abnormal findings on diagnostic imaging of other specified body structures: Secondary | ICD-10-CM | POA: Diagnosis not present

## 2018-11-01 DIAGNOSIS — C778 Secondary and unspecified malignant neoplasm of lymph nodes of multiple regions: Secondary | ICD-10-CM | POA: Diagnosis not present

## 2018-11-01 DIAGNOSIS — R600 Localized edema: Secondary | ICD-10-CM | POA: Diagnosis not present

## 2018-11-01 DIAGNOSIS — Z5112 Encounter for antineoplastic immunotherapy: Secondary | ICD-10-CM | POA: Diagnosis not present

## 2018-11-01 DIAGNOSIS — R21 Rash and other nonspecific skin eruption: Secondary | ICD-10-CM | POA: Diagnosis not present

## 2018-11-01 DIAGNOSIS — E039 Hypothyroidism, unspecified: Secondary | ICD-10-CM | POA: Diagnosis not present

## 2018-11-01 DIAGNOSIS — Z8052 Family history of malignant neoplasm of bladder: Secondary | ICD-10-CM | POA: Diagnosis not present

## 2018-11-01 DIAGNOSIS — Z87891 Personal history of nicotine dependence: Secondary | ICD-10-CM | POA: Diagnosis not present

## 2018-11-30 DIAGNOSIS — C688 Malignant neoplasm of overlapping sites of urinary organs: Secondary | ICD-10-CM | POA: Diagnosis not present

## 2018-11-30 DIAGNOSIS — Z87891 Personal history of nicotine dependence: Secondary | ICD-10-CM | POA: Diagnosis not present

## 2018-11-30 DIAGNOSIS — C778 Secondary and unspecified malignant neoplasm of lymph nodes of multiple regions: Secondary | ICD-10-CM | POA: Diagnosis not present

## 2018-11-30 DIAGNOSIS — Z006 Encounter for examination for normal comparison and control in clinical research program: Secondary | ICD-10-CM | POA: Diagnosis not present

## 2018-11-30 DIAGNOSIS — Z5112 Encounter for antineoplastic immunotherapy: Secondary | ICD-10-CM | POA: Diagnosis not present

## 2018-11-30 DIAGNOSIS — Z436 Encounter for attention to other artificial openings of urinary tract: Secondary | ICD-10-CM | POA: Diagnosis not present

## 2018-11-30 DIAGNOSIS — Z79899 Other long term (current) drug therapy: Secondary | ICD-10-CM | POA: Diagnosis not present

## 2018-11-30 DIAGNOSIS — E039 Hypothyroidism, unspecified: Secondary | ICD-10-CM | POA: Diagnosis not present

## 2018-11-30 DIAGNOSIS — C689 Malignant neoplasm of urinary organ, unspecified: Secondary | ICD-10-CM | POA: Diagnosis not present

## 2018-11-30 DIAGNOSIS — Z466 Encounter for fitting and adjustment of urinary device: Secondary | ICD-10-CM | POA: Diagnosis not present

## 2018-12-20 HISTORY — PX: OTHER SURGICAL HISTORY: SHX169

## 2018-12-21 DIAGNOSIS — R509 Fever, unspecified: Secondary | ICD-10-CM | POA: Diagnosis not present

## 2018-12-21 DIAGNOSIS — R05 Cough: Secondary | ICD-10-CM | POA: Diagnosis not present

## 2018-12-27 DIAGNOSIS — Z23 Encounter for immunization: Secondary | ICD-10-CM | POA: Diagnosis not present

## 2018-12-27 DIAGNOSIS — C679 Malignant neoplasm of bladder, unspecified: Secondary | ICD-10-CM | POA: Diagnosis not present

## 2018-12-27 DIAGNOSIS — Z7901 Long term (current) use of anticoagulants: Secondary | ICD-10-CM | POA: Diagnosis not present

## 2018-12-27 DIAGNOSIS — N1330 Unspecified hydronephrosis: Secondary | ICD-10-CM | POA: Diagnosis not present

## 2018-12-27 DIAGNOSIS — E039 Hypothyroidism, unspecified: Secondary | ICD-10-CM | POA: Diagnosis not present

## 2018-12-27 DIAGNOSIS — Z006 Encounter for examination for normal comparison and control in clinical research program: Secondary | ICD-10-CM | POA: Diagnosis not present

## 2018-12-27 DIAGNOSIS — Z5112 Encounter for antineoplastic immunotherapy: Secondary | ICD-10-CM | POA: Diagnosis not present

## 2018-12-27 DIAGNOSIS — Z86718 Personal history of other venous thrombosis and embolism: Secondary | ICD-10-CM | POA: Diagnosis not present

## 2018-12-27 DIAGNOSIS — C778 Secondary and unspecified malignant neoplasm of lymph nodes of multiple regions: Secondary | ICD-10-CM | POA: Diagnosis not present

## 2018-12-27 DIAGNOSIS — Z79899 Other long term (current) drug therapy: Secondary | ICD-10-CM | POA: Diagnosis not present

## 2018-12-27 DIAGNOSIS — D649 Anemia, unspecified: Secondary | ICD-10-CM | POA: Diagnosis not present

## 2018-12-27 DIAGNOSIS — R21 Rash and other nonspecific skin eruption: Secondary | ICD-10-CM | POA: Diagnosis not present

## 2018-12-27 DIAGNOSIS — Z87891 Personal history of nicotine dependence: Secondary | ICD-10-CM | POA: Diagnosis not present

## 2019-01-02 DIAGNOSIS — Z87891 Personal history of nicotine dependence: Secondary | ICD-10-CM | POA: Diagnosis not present

## 2019-01-02 DIAGNOSIS — C778 Secondary and unspecified malignant neoplasm of lymph nodes of multiple regions: Secondary | ICD-10-CM | POA: Diagnosis not present

## 2019-01-02 DIAGNOSIS — I890 Lymphedema, not elsewhere classified: Secondary | ICD-10-CM | POA: Diagnosis not present

## 2019-01-02 DIAGNOSIS — C679 Malignant neoplasm of bladder, unspecified: Secondary | ICD-10-CM | POA: Diagnosis not present

## 2019-01-02 DIAGNOSIS — Z79899 Other long term (current) drug therapy: Secondary | ICD-10-CM | POA: Diagnosis not present

## 2019-01-02 DIAGNOSIS — Z86718 Personal history of other venous thrombosis and embolism: Secondary | ICD-10-CM | POA: Diagnosis not present

## 2019-01-02 DIAGNOSIS — E039 Hypothyroidism, unspecified: Secondary | ICD-10-CM | POA: Diagnosis not present

## 2019-01-02 DIAGNOSIS — U071 COVID-19: Secondary | ICD-10-CM | POA: Diagnosis not present

## 2019-01-02 DIAGNOSIS — N1330 Unspecified hydronephrosis: Secondary | ICD-10-CM | POA: Diagnosis not present

## 2019-01-02 DIAGNOSIS — Z7901 Long term (current) use of anticoagulants: Secondary | ICD-10-CM | POA: Diagnosis not present

## 2019-01-03 DIAGNOSIS — Z86718 Personal history of other venous thrombosis and embolism: Secondary | ICD-10-CM | POA: Diagnosis not present

## 2019-01-03 DIAGNOSIS — Z79899 Other long term (current) drug therapy: Secondary | ICD-10-CM | POA: Diagnosis not present

## 2019-01-03 DIAGNOSIS — C778 Secondary and unspecified malignant neoplasm of lymph nodes of multiple regions: Secondary | ICD-10-CM | POA: Diagnosis not present

## 2019-01-03 DIAGNOSIS — E039 Hypothyroidism, unspecified: Secondary | ICD-10-CM | POA: Diagnosis not present

## 2019-01-03 DIAGNOSIS — U071 COVID-19: Secondary | ICD-10-CM | POA: Diagnosis not present

## 2019-01-03 DIAGNOSIS — N1330 Unspecified hydronephrosis: Secondary | ICD-10-CM | POA: Diagnosis not present

## 2019-01-03 DIAGNOSIS — Z87891 Personal history of nicotine dependence: Secondary | ICD-10-CM | POA: Diagnosis not present

## 2019-01-03 DIAGNOSIS — I890 Lymphedema, not elsewhere classified: Secondary | ICD-10-CM | POA: Diagnosis not present

## 2019-01-03 DIAGNOSIS — Z7901 Long term (current) use of anticoagulants: Secondary | ICD-10-CM | POA: Diagnosis not present

## 2019-01-03 DIAGNOSIS — C678 Malignant neoplasm of overlapping sites of bladder: Secondary | ICD-10-CM | POA: Diagnosis not present

## 2019-01-03 DIAGNOSIS — C679 Malignant neoplasm of bladder, unspecified: Secondary | ICD-10-CM | POA: Diagnosis not present

## 2019-01-25 DIAGNOSIS — Z87891 Personal history of nicotine dependence: Secondary | ICD-10-CM | POA: Diagnosis not present

## 2019-01-25 DIAGNOSIS — Z8052 Family history of malignant neoplasm of bladder: Secondary | ICD-10-CM | POA: Diagnosis not present

## 2019-01-25 DIAGNOSIS — C778 Secondary and unspecified malignant neoplasm of lymph nodes of multiple regions: Secondary | ICD-10-CM | POA: Diagnosis not present

## 2019-01-25 DIAGNOSIS — Z7901 Long term (current) use of anticoagulants: Secondary | ICD-10-CM | POA: Diagnosis not present

## 2019-01-25 DIAGNOSIS — Z006 Encounter for examination for normal comparison and control in clinical research program: Secondary | ICD-10-CM | POA: Diagnosis not present

## 2019-01-25 DIAGNOSIS — C679 Malignant neoplasm of bladder, unspecified: Secondary | ICD-10-CM | POA: Diagnosis not present

## 2019-01-25 DIAGNOSIS — Z86718 Personal history of other venous thrombosis and embolism: Secondary | ICD-10-CM | POA: Diagnosis not present

## 2019-01-25 DIAGNOSIS — Z5112 Encounter for antineoplastic immunotherapy: Secondary | ICD-10-CM | POA: Diagnosis not present

## 2019-01-25 DIAGNOSIS — Z79899 Other long term (current) drug therapy: Secondary | ICD-10-CM | POA: Diagnosis not present

## 2019-01-25 DIAGNOSIS — R21 Rash and other nonspecific skin eruption: Secondary | ICD-10-CM | POA: Diagnosis not present

## 2019-01-25 DIAGNOSIS — C678 Malignant neoplasm of overlapping sites of bladder: Secondary | ICD-10-CM | POA: Diagnosis not present

## 2019-01-25 DIAGNOSIS — D649 Anemia, unspecified: Secondary | ICD-10-CM | POA: Diagnosis not present

## 2019-01-25 DIAGNOSIS — N1330 Unspecified hydronephrosis: Secondary | ICD-10-CM | POA: Diagnosis not present

## 2019-01-30 DIAGNOSIS — I890 Lymphedema, not elsewhere classified: Secondary | ICD-10-CM | POA: Diagnosis not present

## 2019-01-30 DIAGNOSIS — C678 Malignant neoplasm of overlapping sites of bladder: Secondary | ICD-10-CM | POA: Diagnosis not present

## 2019-01-30 DIAGNOSIS — C778 Secondary and unspecified malignant neoplasm of lymph nodes of multiple regions: Secondary | ICD-10-CM | POA: Diagnosis not present

## 2019-01-30 DIAGNOSIS — N1330 Unspecified hydronephrosis: Secondary | ICD-10-CM | POA: Diagnosis not present

## 2019-01-31 IMAGING — US US SCROTUM W/ DOPPLER COMPLETE
2 series · 13 of 25 positions shown · non-contrast
Comparison: None.

CLINICAL DATA: Groin swelling.

EXAM:
SCROTAL ULTRASOUND
DOPPLER ULTRASOUND OF THE TESTICLES
TECHNIQUE: Complete ultrasound examination of the testicles, epididymis, and
other scrotal structures was performed. Color and spectral Doppler
ultrasound were also utilized to evaluate blood flow to the
testicles.

[Series 1: us scrotum w/ doppler complete · 0.08mm/px · 74 acquisitions, 12 frames shown (1 of 2)]
[im 1/74]
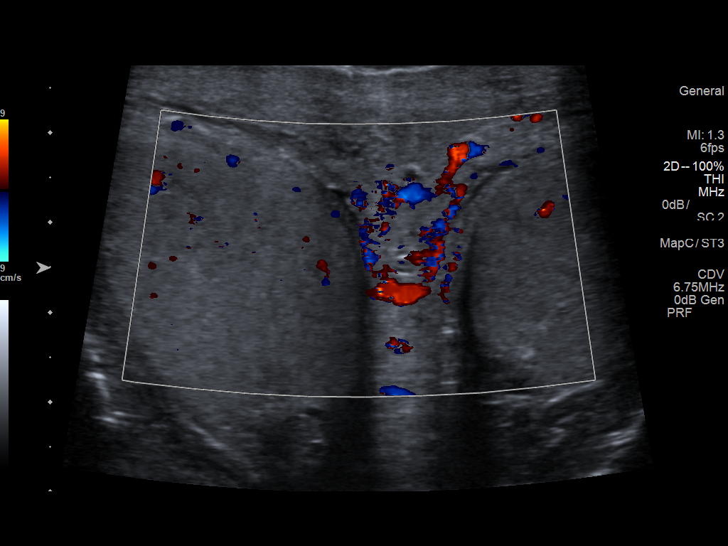
[im 7/74]
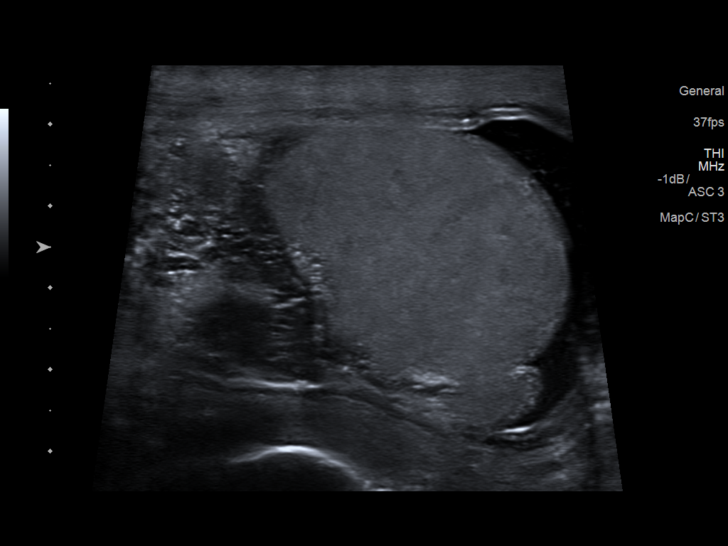
[im 13/74]
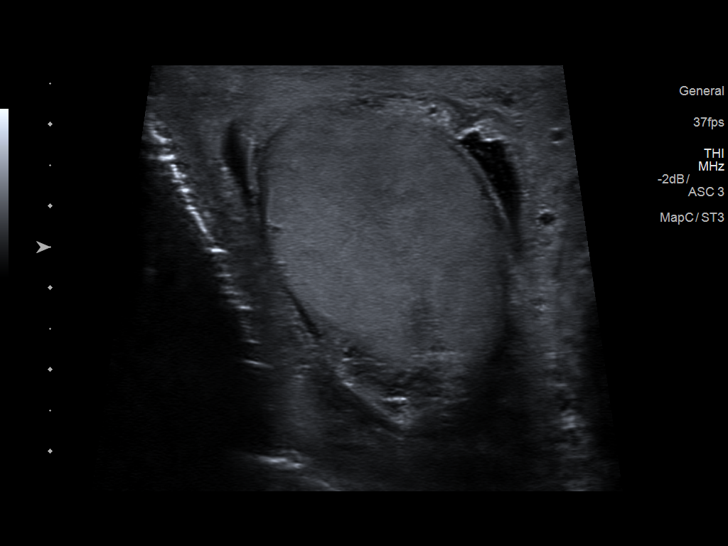
[im 20/74]
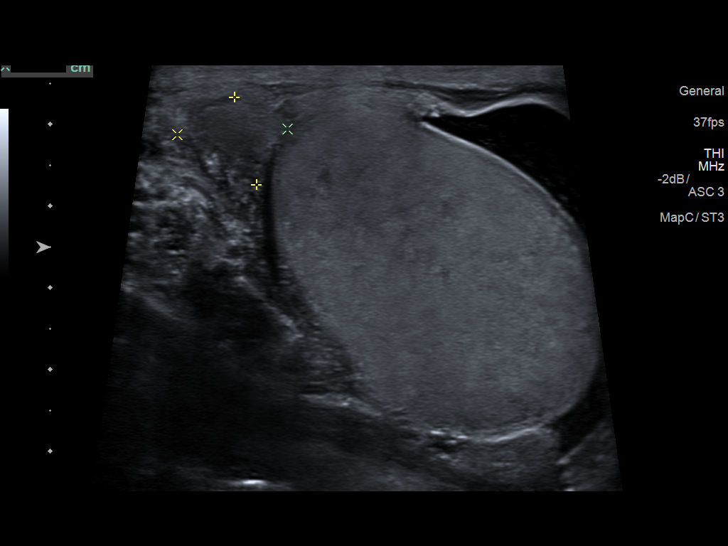
[im 26/74]
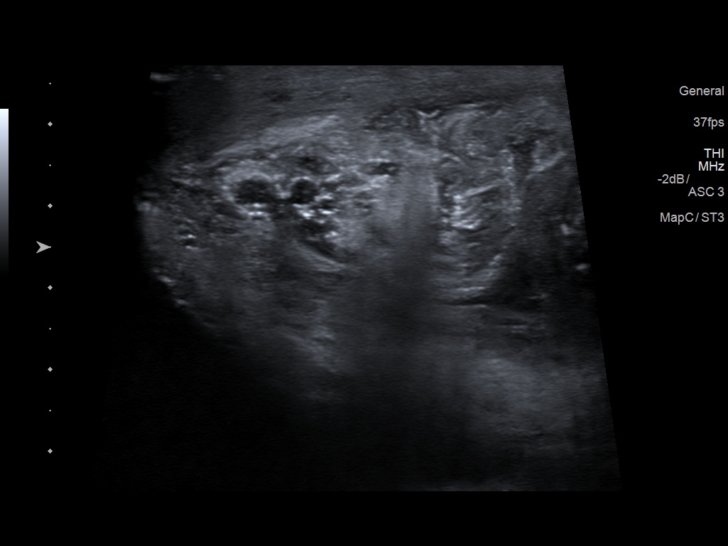
[im 32/74]
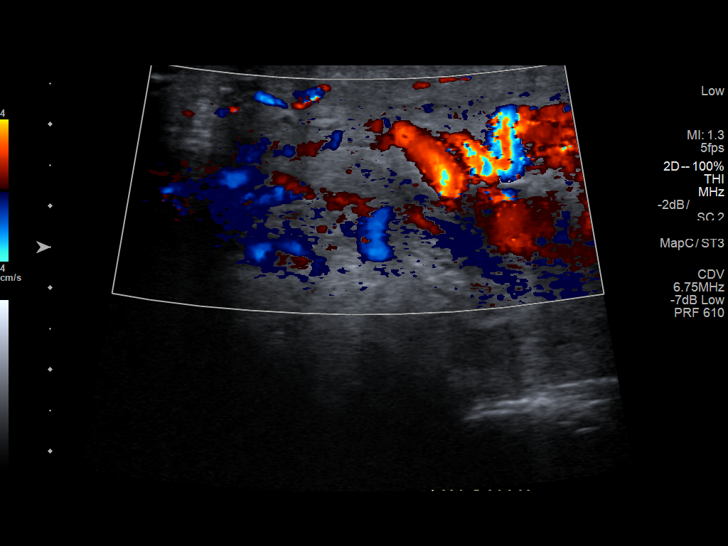
[im 39/74]
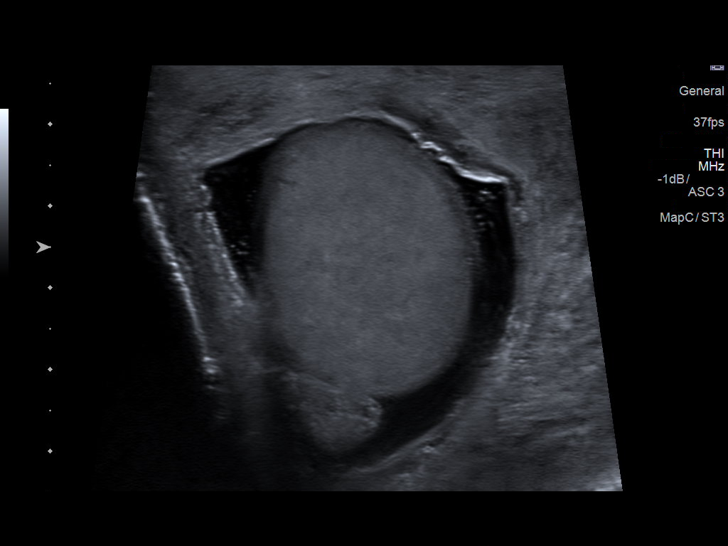
[im 45/74]
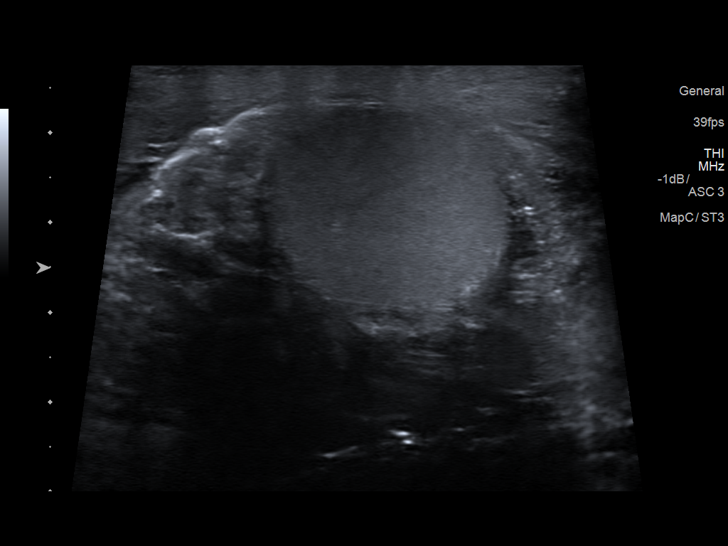
[im 51/74]
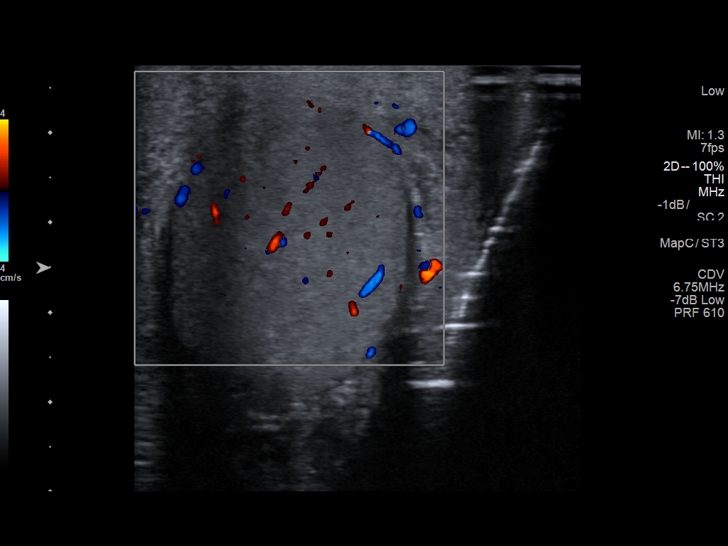
[im 58/74]
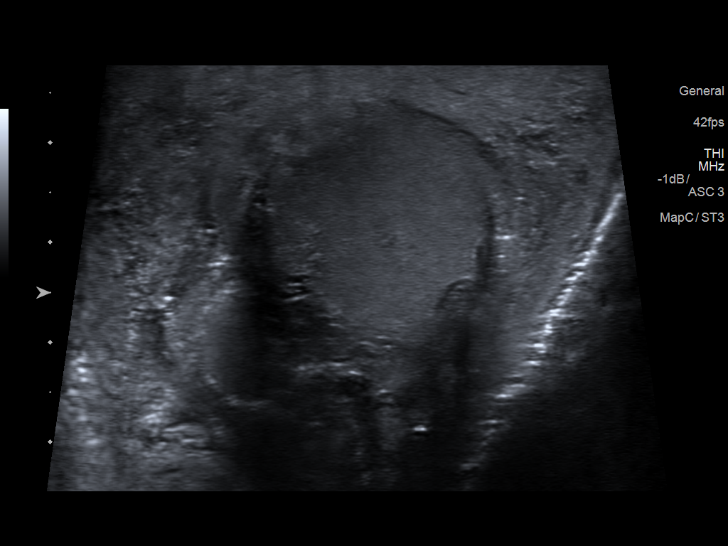
[im 64/74]
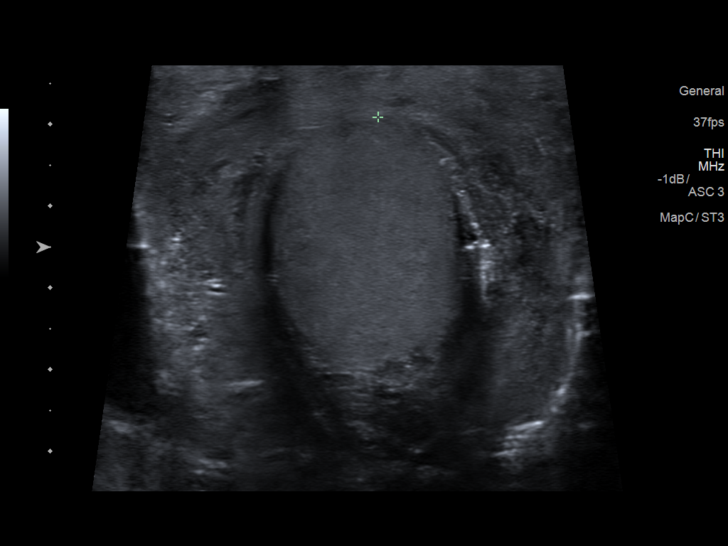
[im 70/74]
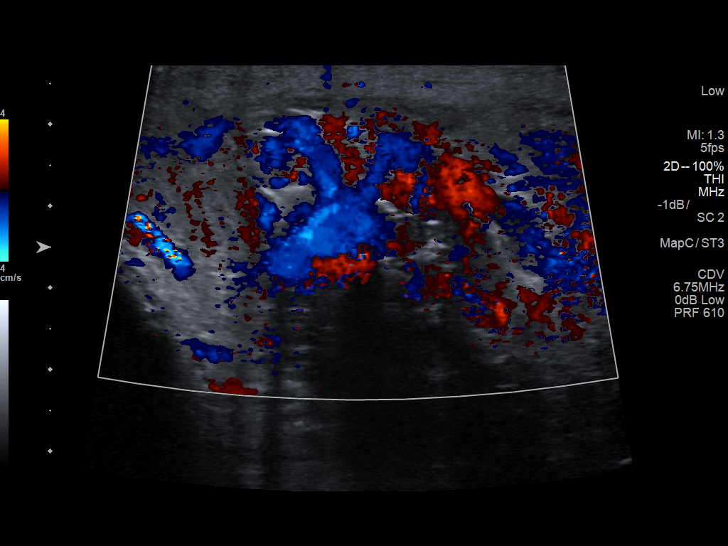

[Series 2: us scrotum w/ doppler complete · 1 of 4 slices shown (2 of 2)]
[im 1/4]
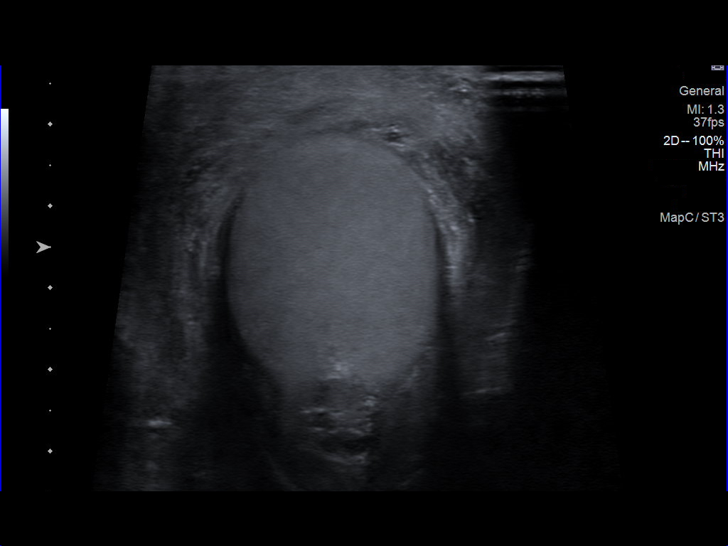

[13 of 25 positions shown; findings below may reference images not displayed]

FINDINGS: Right testicle

Measurements: 4.5 x 3.2 x 3.1 cm. Homogeneous echogenicity with
normal blood flow. No mass or microlithiasis visualized.

Left testicle

Measurements: 3.9 x 2.6 x 2.6 cm. Homogeneous echogenicity with
normal blood flow. No mass or microlithiasis visualized.

Right epididymis:  Normal in size and appearance.

Left epididymis:  Normal in size and appearance.

Hydrocele:  Moderate on the right, complex.  Small on the left.

Varicocele:  Bilateral.

Pulsed Doppler interrogation of both testes demonstrates normal low
resistance arterial and venous waveforms bilaterally.

Diffuse symmetric scrotal skin thickening up to 1 cm.
IMPRESSION: 1. Diffuse scrotal skin thickening which may be infectious or
inflammatory.
2. Moderate complex right hydrocele.  Tiny left hydrocele.
3. Bilateral varicoceles.
4. Normal sonographic appearance of the testes with normal blood
flow.

## 2019-02-19 HISTORY — PX: OTHER SURGICAL HISTORY: SHX169

## 2019-02-22 DIAGNOSIS — Z86718 Personal history of other venous thrombosis and embolism: Secondary | ICD-10-CM | POA: Diagnosis not present

## 2019-02-22 DIAGNOSIS — D630 Anemia in neoplastic disease: Secondary | ICD-10-CM | POA: Diagnosis not present

## 2019-02-22 DIAGNOSIS — N1330 Unspecified hydronephrosis: Secondary | ICD-10-CM | POA: Diagnosis not present

## 2019-02-22 DIAGNOSIS — C778 Secondary and unspecified malignant neoplasm of lymph nodes of multiple regions: Secondary | ICD-10-CM | POA: Diagnosis not present

## 2019-02-22 DIAGNOSIS — Z7901 Long term (current) use of anticoagulants: Secondary | ICD-10-CM | POA: Diagnosis not present

## 2019-02-22 DIAGNOSIS — E039 Hypothyroidism, unspecified: Secondary | ICD-10-CM | POA: Diagnosis not present

## 2019-02-22 DIAGNOSIS — I890 Lymphedema, not elsewhere classified: Secondary | ICD-10-CM | POA: Diagnosis not present

## 2019-02-22 DIAGNOSIS — Z006 Encounter for examination for normal comparison and control in clinical research program: Secondary | ICD-10-CM | POA: Diagnosis not present

## 2019-02-22 DIAGNOSIS — Z87891 Personal history of nicotine dependence: Secondary | ICD-10-CM | POA: Diagnosis not present

## 2019-02-22 DIAGNOSIS — C678 Malignant neoplasm of overlapping sites of bladder: Secondary | ICD-10-CM | POA: Diagnosis not present

## 2019-02-22 DIAGNOSIS — Z79899 Other long term (current) drug therapy: Secondary | ICD-10-CM | POA: Diagnosis not present

## 2019-02-22 DIAGNOSIS — R21 Rash and other nonspecific skin eruption: Secondary | ICD-10-CM | POA: Diagnosis not present

## 2019-03-01 DIAGNOSIS — N135 Crossing vessel and stricture of ureter without hydronephrosis: Secondary | ICD-10-CM | POA: Diagnosis not present

## 2019-03-01 DIAGNOSIS — Z9221 Personal history of antineoplastic chemotherapy: Secondary | ICD-10-CM | POA: Diagnosis not present

## 2019-03-01 DIAGNOSIS — C679 Malignant neoplasm of bladder, unspecified: Secondary | ICD-10-CM | POA: Diagnosis not present

## 2019-03-01 DIAGNOSIS — Z7901 Long term (current) use of anticoagulants: Secondary | ICD-10-CM | POA: Diagnosis not present

## 2019-03-01 DIAGNOSIS — N182 Chronic kidney disease, stage 2 (mild): Secondary | ICD-10-CM | POA: Diagnosis not present

## 2019-03-01 DIAGNOSIS — Z923 Personal history of irradiation: Secondary | ICD-10-CM | POA: Diagnosis not present

## 2019-03-01 DIAGNOSIS — C775 Secondary and unspecified malignant neoplasm of intrapelvic lymph nodes: Secondary | ICD-10-CM | POA: Diagnosis not present

## 2019-03-01 DIAGNOSIS — Z79899 Other long term (current) drug therapy: Secondary | ICD-10-CM | POA: Diagnosis not present

## 2019-03-01 DIAGNOSIS — Z87891 Personal history of nicotine dependence: Secondary | ICD-10-CM | POA: Diagnosis not present

## 2019-03-01 DIAGNOSIS — G8918 Other acute postprocedural pain: Secondary | ICD-10-CM | POA: Diagnosis not present

## 2019-03-01 DIAGNOSIS — E039 Hypothyroidism, unspecified: Secondary | ICD-10-CM | POA: Diagnosis not present

## 2019-03-01 DIAGNOSIS — I890 Lymphedema, not elsewhere classified: Secondary | ICD-10-CM | POA: Diagnosis not present

## 2019-03-01 DIAGNOSIS — C678 Malignant neoplasm of overlapping sites of bladder: Secondary | ICD-10-CM | POA: Diagnosis not present

## 2019-03-01 DIAGNOSIS — Z86718 Personal history of other venous thrombosis and embolism: Secondary | ICD-10-CM | POA: Diagnosis not present

## 2019-03-11 DIAGNOSIS — Z936 Other artificial openings of urinary tract status: Secondary | ICD-10-CM | POA: Diagnosis not present

## 2019-03-27 DIAGNOSIS — C678 Malignant neoplasm of overlapping sites of bladder: Secondary | ICD-10-CM | POA: Diagnosis not present

## 2019-04-16 DIAGNOSIS — Z906 Acquired absence of other parts of urinary tract: Secondary | ICD-10-CM | POA: Diagnosis not present

## 2019-04-16 DIAGNOSIS — Z86718 Personal history of other venous thrombosis and embolism: Secondary | ICD-10-CM | POA: Diagnosis not present

## 2019-04-16 DIAGNOSIS — N5089 Other specified disorders of the male genital organs: Secondary | ICD-10-CM | POA: Diagnosis not present

## 2019-04-16 DIAGNOSIS — C778 Secondary and unspecified malignant neoplasm of lymph nodes of multiple regions: Secondary | ICD-10-CM | POA: Diagnosis not present

## 2019-04-16 DIAGNOSIS — N2889 Other specified disorders of kidney and ureter: Secondary | ICD-10-CM | POA: Diagnosis not present

## 2019-04-16 DIAGNOSIS — Z87891 Personal history of nicotine dependence: Secondary | ICD-10-CM | POA: Diagnosis not present

## 2019-04-16 DIAGNOSIS — Z5112 Encounter for antineoplastic immunotherapy: Secondary | ICD-10-CM | POA: Diagnosis not present

## 2019-04-16 DIAGNOSIS — C678 Malignant neoplasm of overlapping sites of bladder: Secondary | ICD-10-CM | POA: Diagnosis not present

## 2019-04-16 DIAGNOSIS — R030 Elevated blood-pressure reading, without diagnosis of hypertension: Secondary | ICD-10-CM | POA: Diagnosis not present

## 2019-04-16 DIAGNOSIS — R600 Localized edema: Secondary | ICD-10-CM | POA: Diagnosis not present

## 2019-04-16 DIAGNOSIS — Z7901 Long term (current) use of anticoagulants: Secondary | ICD-10-CM | POA: Diagnosis not present

## 2019-04-16 DIAGNOSIS — D649 Anemia, unspecified: Secondary | ICD-10-CM | POA: Diagnosis not present

## 2019-04-16 DIAGNOSIS — R509 Fever, unspecified: Secondary | ICD-10-CM | POA: Diagnosis not present

## 2019-04-16 DIAGNOSIS — T451X5A Adverse effect of antineoplastic and immunosuppressive drugs, initial encounter: Secondary | ICD-10-CM | POA: Diagnosis not present

## 2019-04-16 DIAGNOSIS — Z79899 Other long term (current) drug therapy: Secondary | ICD-10-CM | POA: Diagnosis not present

## 2019-04-18 DIAGNOSIS — Z5111 Encounter for antineoplastic chemotherapy: Secondary | ICD-10-CM | POA: Diagnosis not present

## 2019-04-18 DIAGNOSIS — C678 Malignant neoplasm of overlapping sites of bladder: Secondary | ICD-10-CM | POA: Diagnosis not present

## 2019-04-18 DIAGNOSIS — T8089XA Other complications following infusion, transfusion and therapeutic injection, initial encounter: Secondary | ICD-10-CM | POA: Diagnosis not present

## 2019-04-18 DIAGNOSIS — C778 Secondary and unspecified malignant neoplasm of lymph nodes of multiple regions: Secondary | ICD-10-CM | POA: Diagnosis not present

## 2019-04-21 DIAGNOSIS — C778 Secondary and unspecified malignant neoplasm of lymph nodes of multiple regions: Secondary | ICD-10-CM | POA: Diagnosis not present

## 2019-04-21 DIAGNOSIS — C678 Malignant neoplasm of overlapping sites of bladder: Secondary | ICD-10-CM | POA: Diagnosis not present

## 2019-04-30 DIAGNOSIS — C678 Malignant neoplasm of overlapping sites of bladder: Secondary | ICD-10-CM | POA: Diagnosis not present

## 2019-04-30 DIAGNOSIS — C778 Secondary and unspecified malignant neoplasm of lymph nodes of multiple regions: Secondary | ICD-10-CM | POA: Diagnosis not present

## 2019-05-02 DIAGNOSIS — Z9079 Acquired absence of other genital organ(s): Secondary | ICD-10-CM | POA: Diagnosis not present

## 2019-05-02 DIAGNOSIS — I10 Essential (primary) hypertension: Secondary | ICD-10-CM | POA: Diagnosis not present

## 2019-05-02 DIAGNOSIS — M7989 Other specified soft tissue disorders: Secondary | ICD-10-CM | POA: Diagnosis not present

## 2019-05-02 DIAGNOSIS — R682 Dry mouth, unspecified: Secondary | ICD-10-CM | POA: Diagnosis not present

## 2019-05-02 DIAGNOSIS — E871 Hypo-osmolality and hyponatremia: Secondary | ICD-10-CM | POA: Diagnosis not present

## 2019-05-02 DIAGNOSIS — R000 Tachycardia, unspecified: Secondary | ICD-10-CM | POA: Diagnosis not present

## 2019-05-02 DIAGNOSIS — I890 Lymphedema, not elsewhere classified: Secondary | ICD-10-CM | POA: Diagnosis not present

## 2019-05-02 DIAGNOSIS — D630 Anemia in neoplastic disease: Secondary | ICD-10-CM | POA: Diagnosis not present

## 2019-05-02 DIAGNOSIS — Z79899 Other long term (current) drug therapy: Secondary | ICD-10-CM | POA: Diagnosis not present

## 2019-05-02 DIAGNOSIS — T50905D Adverse effect of unspecified drugs, medicaments and biological substances, subsequent encounter: Secondary | ICD-10-CM | POA: Diagnosis not present

## 2019-05-02 DIAGNOSIS — M545 Low back pain: Secondary | ICD-10-CM | POA: Diagnosis not present

## 2019-05-02 DIAGNOSIS — R600 Localized edema: Secondary | ICD-10-CM | POA: Diagnosis not present

## 2019-05-02 DIAGNOSIS — C678 Malignant neoplasm of overlapping sites of bladder: Secondary | ICD-10-CM | POA: Diagnosis not present

## 2019-05-02 DIAGNOSIS — C778 Secondary and unspecified malignant neoplasm of lymph nodes of multiple regions: Secondary | ICD-10-CM | POA: Diagnosis not present

## 2019-05-02 DIAGNOSIS — Z5112 Encounter for antineoplastic immunotherapy: Secondary | ICD-10-CM | POA: Diagnosis not present

## 2019-05-02 DIAGNOSIS — Z87891 Personal history of nicotine dependence: Secondary | ICD-10-CM | POA: Diagnosis not present

## 2019-05-02 DIAGNOSIS — R6883 Chills (without fever): Secondary | ICD-10-CM | POA: Diagnosis not present

## 2019-05-16 DIAGNOSIS — R530 Neoplastic (malignant) related fatigue: Secondary | ICD-10-CM | POA: Diagnosis not present

## 2019-05-16 DIAGNOSIS — Z86718 Personal history of other venous thrombosis and embolism: Secondary | ICD-10-CM | POA: Diagnosis not present

## 2019-05-16 DIAGNOSIS — E871 Hypo-osmolality and hyponatremia: Secondary | ICD-10-CM | POA: Diagnosis not present

## 2019-05-16 DIAGNOSIS — C679 Malignant neoplasm of bladder, unspecified: Secondary | ICD-10-CM | POA: Diagnosis not present

## 2019-05-16 DIAGNOSIS — I890 Lymphedema, not elsewhere classified: Secondary | ICD-10-CM | POA: Diagnosis not present

## 2019-05-16 DIAGNOSIS — D630 Anemia in neoplastic disease: Secondary | ICD-10-CM | POA: Diagnosis not present

## 2019-05-16 DIAGNOSIS — Z006 Encounter for examination for normal comparison and control in clinical research program: Secondary | ICD-10-CM | POA: Diagnosis not present

## 2019-05-16 DIAGNOSIS — Z5112 Encounter for antineoplastic immunotherapy: Secondary | ICD-10-CM | POA: Diagnosis not present

## 2019-05-16 DIAGNOSIS — C678 Malignant neoplasm of overlapping sites of bladder: Secondary | ICD-10-CM | POA: Diagnosis not present

## 2019-05-16 DIAGNOSIS — Z87891 Personal history of nicotine dependence: Secondary | ICD-10-CM | POA: Diagnosis not present

## 2019-05-16 DIAGNOSIS — M545 Low back pain: Secondary | ICD-10-CM | POA: Diagnosis not present

## 2019-05-16 DIAGNOSIS — Z7901 Long term (current) use of anticoagulants: Secondary | ICD-10-CM | POA: Diagnosis not present

## 2019-05-16 DIAGNOSIS — Z79899 Other long term (current) drug therapy: Secondary | ICD-10-CM | POA: Diagnosis not present

## 2019-05-16 DIAGNOSIS — C778 Secondary and unspecified malignant neoplasm of lymph nodes of multiple regions: Secondary | ICD-10-CM | POA: Diagnosis not present

## 2019-05-16 DIAGNOSIS — N289 Disorder of kidney and ureter, unspecified: Secondary | ICD-10-CM | POA: Diagnosis not present

## 2019-05-28 DIAGNOSIS — D630 Anemia in neoplastic disease: Secondary | ICD-10-CM | POA: Diagnosis not present

## 2019-05-28 DIAGNOSIS — I890 Lymphedema, not elsewhere classified: Secondary | ICD-10-CM | POA: Diagnosis not present

## 2019-05-28 DIAGNOSIS — Z87891 Personal history of nicotine dependence: Secondary | ICD-10-CM | POA: Diagnosis not present

## 2019-05-28 DIAGNOSIS — C678 Malignant neoplasm of overlapping sites of bladder: Secondary | ICD-10-CM | POA: Diagnosis not present

## 2019-05-28 DIAGNOSIS — N289 Disorder of kidney and ureter, unspecified: Secondary | ICD-10-CM | POA: Diagnosis not present

## 2019-05-28 DIAGNOSIS — Z79899 Other long term (current) drug therapy: Secondary | ICD-10-CM | POA: Diagnosis not present

## 2019-05-28 DIAGNOSIS — E871 Hypo-osmolality and hyponatremia: Secondary | ICD-10-CM | POA: Diagnosis not present

## 2019-05-28 DIAGNOSIS — Z5112 Encounter for antineoplastic immunotherapy: Secondary | ICD-10-CM | POA: Diagnosis not present

## 2019-05-28 DIAGNOSIS — C778 Secondary and unspecified malignant neoplasm of lymph nodes of multiple regions: Secondary | ICD-10-CM | POA: Diagnosis not present

## 2019-05-28 DIAGNOSIS — R530 Neoplastic (malignant) related fatigue: Secondary | ICD-10-CM | POA: Diagnosis not present

## 2019-05-28 DIAGNOSIS — Z86718 Personal history of other venous thrombosis and embolism: Secondary | ICD-10-CM | POA: Diagnosis not present

## 2019-05-28 DIAGNOSIS — Z7901 Long term (current) use of anticoagulants: Secondary | ICD-10-CM | POA: Diagnosis not present

## 2019-05-28 DIAGNOSIS — M545 Low back pain: Secondary | ICD-10-CM | POA: Diagnosis not present

## 2019-05-29 DIAGNOSIS — N1330 Unspecified hydronephrosis: Secondary | ICD-10-CM | POA: Diagnosis not present

## 2019-05-29 DIAGNOSIS — N2889 Other specified disorders of kidney and ureter: Secondary | ICD-10-CM | POA: Diagnosis not present

## 2019-05-29 DIAGNOSIS — C678 Malignant neoplasm of overlapping sites of bladder: Secondary | ICD-10-CM | POA: Diagnosis not present

## 2019-05-29 DIAGNOSIS — R944 Abnormal results of kidney function studies: Secondary | ICD-10-CM | POA: Diagnosis not present

## 2019-05-29 DIAGNOSIS — N289 Disorder of kidney and ureter, unspecified: Secondary | ICD-10-CM | POA: Diagnosis not present

## 2019-06-12 DIAGNOSIS — R530 Neoplastic (malignant) related fatigue: Secondary | ICD-10-CM | POA: Diagnosis not present

## 2019-06-12 DIAGNOSIS — M545 Low back pain: Secondary | ICD-10-CM | POA: Diagnosis not present

## 2019-06-12 DIAGNOSIS — Z5112 Encounter for antineoplastic immunotherapy: Secondary | ICD-10-CM | POA: Diagnosis not present

## 2019-06-12 DIAGNOSIS — R600 Localized edema: Secondary | ICD-10-CM | POA: Diagnosis not present

## 2019-06-12 DIAGNOSIS — Z79899 Other long term (current) drug therapy: Secondary | ICD-10-CM | POA: Diagnosis not present

## 2019-06-12 DIAGNOSIS — T50905D Adverse effect of unspecified drugs, medicaments and biological substances, subsequent encounter: Secondary | ICD-10-CM | POA: Diagnosis not present

## 2019-06-12 DIAGNOSIS — Z86718 Personal history of other venous thrombosis and embolism: Secondary | ICD-10-CM | POA: Diagnosis not present

## 2019-06-12 DIAGNOSIS — C778 Secondary and unspecified malignant neoplasm of lymph nodes of multiple regions: Secondary | ICD-10-CM | POA: Diagnosis not present

## 2019-06-12 DIAGNOSIS — D630 Anemia in neoplastic disease: Secondary | ICD-10-CM | POA: Diagnosis not present

## 2019-06-12 DIAGNOSIS — C678 Malignant neoplasm of overlapping sites of bladder: Secondary | ICD-10-CM | POA: Diagnosis not present

## 2019-06-12 DIAGNOSIS — E871 Hypo-osmolality and hyponatremia: Secondary | ICD-10-CM | POA: Diagnosis not present

## 2019-06-12 DIAGNOSIS — N1330 Unspecified hydronephrosis: Secondary | ICD-10-CM | POA: Diagnosis not present

## 2019-06-12 DIAGNOSIS — N289 Disorder of kidney and ureter, unspecified: Secondary | ICD-10-CM | POA: Diagnosis not present

## 2019-06-24 DIAGNOSIS — Z936 Other artificial openings of urinary tract status: Secondary | ICD-10-CM | POA: Diagnosis not present

## 2019-06-26 DIAGNOSIS — Z79899 Other long term (current) drug therapy: Secondary | ICD-10-CM | POA: Diagnosis not present

## 2019-06-26 DIAGNOSIS — C778 Secondary and unspecified malignant neoplasm of lymph nodes of multiple regions: Secondary | ICD-10-CM | POA: Diagnosis not present

## 2019-06-26 DIAGNOSIS — C678 Malignant neoplasm of overlapping sites of bladder: Secondary | ICD-10-CM | POA: Diagnosis not present

## 2019-06-26 DIAGNOSIS — M791 Myalgia, unspecified site: Secondary | ICD-10-CM | POA: Diagnosis not present

## 2019-06-26 DIAGNOSIS — N289 Disorder of kidney and ureter, unspecified: Secondary | ICD-10-CM | POA: Diagnosis not present

## 2019-06-26 DIAGNOSIS — R530 Neoplastic (malignant) related fatigue: Secondary | ICD-10-CM | POA: Diagnosis not present

## 2019-07-18 DIAGNOSIS — E039 Hypothyroidism, unspecified: Secondary | ICD-10-CM | POA: Diagnosis not present

## 2019-07-18 DIAGNOSIS — Z79899 Other long term (current) drug therapy: Secondary | ICD-10-CM | POA: Diagnosis not present

## 2019-07-18 DIAGNOSIS — Z7952 Long term (current) use of systemic steroids: Secondary | ICD-10-CM | POA: Diagnosis not present

## 2019-07-18 DIAGNOSIS — B9561 Methicillin susceptible Staphylococcus aureus infection as the cause of diseases classified elsewhere: Secondary | ICD-10-CM | POA: Diagnosis not present

## 2019-07-18 DIAGNOSIS — D630 Anemia in neoplastic disease: Secondary | ICD-10-CM | POA: Diagnosis not present

## 2019-07-18 DIAGNOSIS — N289 Disorder of kidney and ureter, unspecified: Secondary | ICD-10-CM | POA: Diagnosis not present

## 2019-07-18 DIAGNOSIS — M4627 Osteomyelitis of vertebra, lumbosacral region: Secondary | ICD-10-CM | POA: Diagnosis not present

## 2019-07-18 DIAGNOSIS — C786 Secondary malignant neoplasm of retroperitoneum and peritoneum: Secondary | ICD-10-CM | POA: Diagnosis not present

## 2019-07-18 DIAGNOSIS — M4646 Discitis, unspecified, lumbar region: Secondary | ICD-10-CM | POA: Diagnosis not present

## 2019-07-18 DIAGNOSIS — M4626 Osteomyelitis of vertebra, lumbar region: Secondary | ICD-10-CM | POA: Diagnosis not present

## 2019-07-18 DIAGNOSIS — N1330 Unspecified hydronephrosis: Secondary | ICD-10-CM | POA: Diagnosis not present

## 2019-07-18 DIAGNOSIS — R000 Tachycardia, unspecified: Secondary | ICD-10-CM | POA: Diagnosis not present

## 2019-07-18 DIAGNOSIS — Z7901 Long term (current) use of anticoagulants: Secondary | ICD-10-CM | POA: Diagnosis not present

## 2019-07-18 DIAGNOSIS — C678 Malignant neoplasm of overlapping sites of bladder: Secondary | ICD-10-CM | POA: Diagnosis not present

## 2019-07-18 DIAGNOSIS — Z20822 Contact with and (suspected) exposure to covid-19: Secondary | ICD-10-CM | POA: Diagnosis not present

## 2019-07-18 DIAGNOSIS — Z791 Long term (current) use of non-steroidal anti-inflammatories (NSAID): Secondary | ICD-10-CM | POA: Diagnosis not present

## 2019-07-18 DIAGNOSIS — C778 Secondary and unspecified malignant neoplasm of lymph nodes of multiple regions: Secondary | ICD-10-CM | POA: Diagnosis not present

## 2019-07-18 DIAGNOSIS — M4317 Spondylolisthesis, lumbosacral region: Secondary | ICD-10-CM | POA: Diagnosis not present

## 2019-07-18 DIAGNOSIS — M4310 Spondylolisthesis, site unspecified: Secondary | ICD-10-CM | POA: Diagnosis not present

## 2019-07-18 DIAGNOSIS — G061 Intraspinal abscess and granuloma: Secondary | ICD-10-CM | POA: Diagnosis not present

## 2019-07-18 DIAGNOSIS — R7881 Bacteremia: Secondary | ICD-10-CM | POA: Diagnosis not present

## 2019-07-19 DIAGNOSIS — B9561 Methicillin susceptible Staphylococcus aureus infection as the cause of diseases classified elsewhere: Secondary | ICD-10-CM | POA: Diagnosis not present

## 2019-07-19 DIAGNOSIS — R7881 Bacteremia: Secondary | ICD-10-CM | POA: Diagnosis not present

## 2019-07-19 DIAGNOSIS — G061 Intraspinal abscess and granuloma: Secondary | ICD-10-CM | POA: Diagnosis not present

## 2019-07-19 DIAGNOSIS — M4317 Spondylolisthesis, lumbosacral region: Secondary | ICD-10-CM | POA: Diagnosis not present

## 2019-07-19 DIAGNOSIS — M4646 Discitis, unspecified, lumbar region: Secondary | ICD-10-CM | POA: Diagnosis not present

## 2019-07-19 DIAGNOSIS — C678 Malignant neoplasm of overlapping sites of bladder: Secondary | ICD-10-CM | POA: Diagnosis not present

## 2019-07-23 DIAGNOSIS — B9561 Methicillin susceptible Staphylococcus aureus infection as the cause of diseases classified elsewhere: Secondary | ICD-10-CM | POA: Diagnosis not present

## 2019-07-23 DIAGNOSIS — M4646 Discitis, unspecified, lumbar region: Secondary | ICD-10-CM | POA: Diagnosis not present

## 2019-07-23 DIAGNOSIS — R7881 Bacteremia: Secondary | ICD-10-CM | POA: Diagnosis not present

## 2019-07-23 DIAGNOSIS — G061 Intraspinal abscess and granuloma: Secondary | ICD-10-CM | POA: Diagnosis not present

## 2019-07-29 DIAGNOSIS — G061 Intraspinal abscess and granuloma: Secondary | ICD-10-CM | POA: Diagnosis not present

## 2019-07-31 DIAGNOSIS — G061 Intraspinal abscess and granuloma: Secondary | ICD-10-CM | POA: Diagnosis not present

## 2019-08-05 DIAGNOSIS — G061 Intraspinal abscess and granuloma: Secondary | ICD-10-CM | POA: Diagnosis not present

## 2019-08-07 DIAGNOSIS — G061 Intraspinal abscess and granuloma: Secondary | ICD-10-CM | POA: Diagnosis not present

## 2019-08-12 DIAGNOSIS — G061 Intraspinal abscess and granuloma: Secondary | ICD-10-CM | POA: Diagnosis not present

## 2019-08-14 DIAGNOSIS — G061 Intraspinal abscess and granuloma: Secondary | ICD-10-CM | POA: Diagnosis not present

## 2019-08-19 DIAGNOSIS — G061 Intraspinal abscess and granuloma: Secondary | ICD-10-CM | POA: Diagnosis not present

## 2019-08-21 DIAGNOSIS — G061 Intraspinal abscess and granuloma: Secondary | ICD-10-CM | POA: Diagnosis not present

## 2019-08-22 DIAGNOSIS — E039 Hypothyroidism, unspecified: Secondary | ICD-10-CM | POA: Diagnosis not present

## 2019-08-22 DIAGNOSIS — B9561 Methicillin susceptible Staphylococcus aureus infection as the cause of diseases classified elsewhere: Secondary | ICD-10-CM | POA: Diagnosis not present

## 2019-08-22 DIAGNOSIS — M4646 Discitis, unspecified, lumbar region: Secondary | ICD-10-CM | POA: Diagnosis not present

## 2019-08-22 DIAGNOSIS — Z452 Encounter for adjustment and management of vascular access device: Secondary | ICD-10-CM | POA: Diagnosis not present

## 2019-08-22 DIAGNOSIS — N059 Unspecified nephritic syndrome with unspecified morphologic changes: Secondary | ICD-10-CM | POA: Diagnosis not present

## 2019-08-22 DIAGNOSIS — G062 Extradural and subdural abscess, unspecified: Secondary | ICD-10-CM | POA: Diagnosis not present

## 2019-08-22 DIAGNOSIS — D630 Anemia in neoplastic disease: Secondary | ICD-10-CM | POA: Diagnosis not present

## 2019-08-22 DIAGNOSIS — Z86718 Personal history of other venous thrombosis and embolism: Secondary | ICD-10-CM | POA: Diagnosis not present

## 2019-08-22 DIAGNOSIS — G061 Intraspinal abscess and granuloma: Secondary | ICD-10-CM | POA: Diagnosis not present

## 2019-08-22 DIAGNOSIS — I890 Lymphedema, not elsewhere classified: Secondary | ICD-10-CM | POA: Diagnosis not present

## 2019-08-22 DIAGNOSIS — C778 Secondary and unspecified malignant neoplasm of lymph nodes of multiple regions: Secondary | ICD-10-CM | POA: Diagnosis not present

## 2019-08-22 DIAGNOSIS — Z79899 Other long term (current) drug therapy: Secondary | ICD-10-CM | POA: Diagnosis not present

## 2019-08-22 DIAGNOSIS — Z792 Long term (current) use of antibiotics: Secondary | ICD-10-CM | POA: Diagnosis not present

## 2019-08-22 DIAGNOSIS — C678 Malignant neoplasm of overlapping sites of bladder: Secondary | ICD-10-CM | POA: Diagnosis not present

## 2019-08-22 DIAGNOSIS — R530 Neoplastic (malignant) related fatigue: Secondary | ICD-10-CM | POA: Diagnosis not present

## 2019-08-22 DIAGNOSIS — N289 Disorder of kidney and ureter, unspecified: Secondary | ICD-10-CM | POA: Diagnosis not present

## 2019-08-22 DIAGNOSIS — R7881 Bacteremia: Secondary | ICD-10-CM | POA: Diagnosis not present

## 2019-08-26 DIAGNOSIS — G061 Intraspinal abscess and granuloma: Secondary | ICD-10-CM | POA: Diagnosis not present

## 2019-08-27 DIAGNOSIS — Z936 Other artificial openings of urinary tract status: Secondary | ICD-10-CM | POA: Diagnosis not present

## 2019-08-28 DIAGNOSIS — G061 Intraspinal abscess and granuloma: Secondary | ICD-10-CM | POA: Diagnosis not present

## 2019-08-30 DIAGNOSIS — G062 Extradural and subdural abscess, unspecified: Secondary | ICD-10-CM | POA: Diagnosis not present

## 2019-09-19 DIAGNOSIS — M545 Low back pain: Secondary | ICD-10-CM | POA: Diagnosis not present

## 2019-09-19 DIAGNOSIS — M5416 Radiculopathy, lumbar region: Secondary | ICD-10-CM | POA: Diagnosis not present

## 2019-09-27 DIAGNOSIS — C678 Malignant neoplasm of overlapping sites of bladder: Secondary | ICD-10-CM | POA: Diagnosis not present

## 2019-09-27 DIAGNOSIS — E039 Hypothyroidism, unspecified: Secondary | ICD-10-CM | POA: Diagnosis not present

## 2019-09-27 DIAGNOSIS — G062 Extradural and subdural abscess, unspecified: Secondary | ICD-10-CM | POA: Diagnosis not present

## 2019-10-15 IMAGING — CT CT ANGIO AOBIFEM WO/W CM
1 of 8 series · 4 of 16 positions shown, 5 images · IV contrast (OMNI 350)
Comparison: None.

CLINICAL DATA: Leg pain or swelling. DVT suspected. Patient reports
right groin and scrotal swelling. Increased right leg swelling.

EXAM:
CT ANGIOGRAPHY OF ABDOMINAL AORTA WITH ILIOFEMORAL RUNOFF
TECHNIQUE: Multidetector CT imaging of the abdomen, pelvis and lower
extremities was performed using the standard protocol during bolus
administration of intravenous contrast. Multiplanar CT image
reconstructions and MIPs were obtained to evaluate the vascular
anatomy.
CONTRAST:  100mL 90FWBT-53R IOPAMIDOL (90FWBT-53R) INJECTION 76%

[Series 5: cta runoff (id) · axial · 0.70mm/px · z∈[-1108,-322]mm · 4 of 438 slices shown, 5 images]
[im 88/438  soft-tissue]
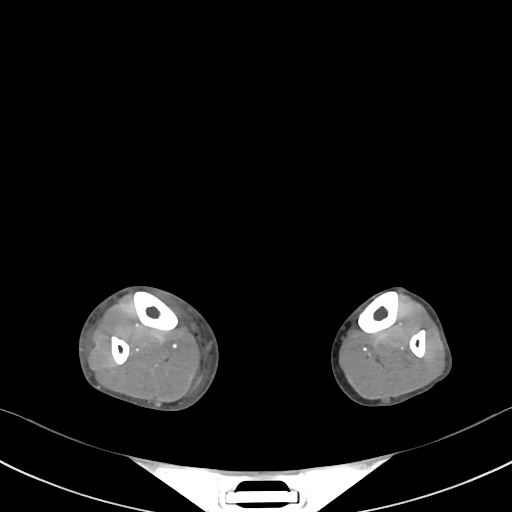
[im 88/438  bone]
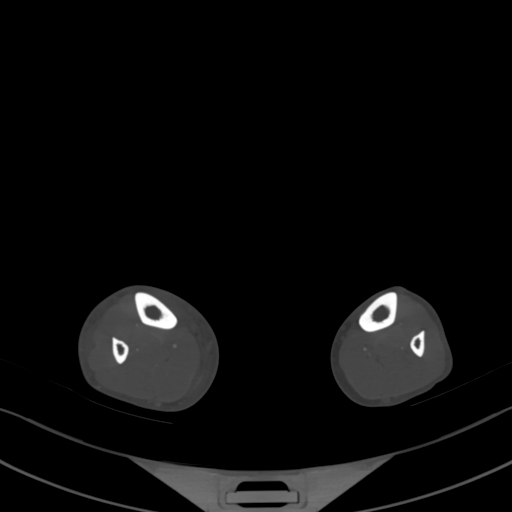
[im 175/438  soft-tissue]
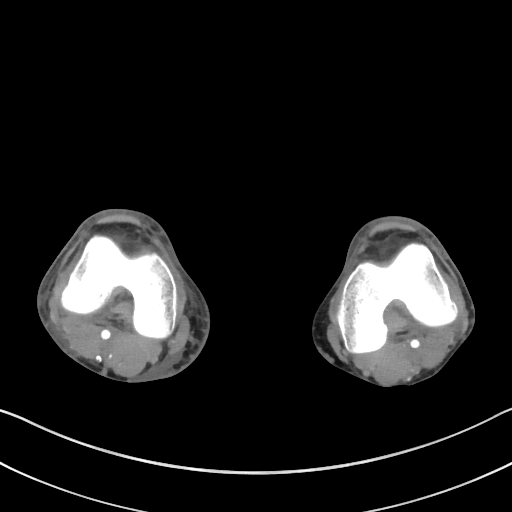
[im 263/438  soft-tissue]
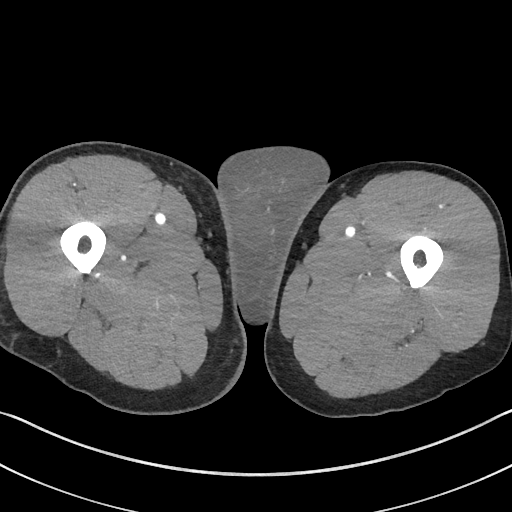
[im 350/438  soft-tissue]
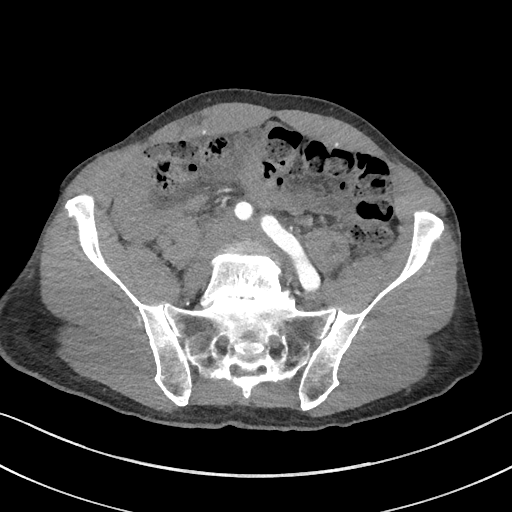

[4 of 16 positions shown; findings below may reference images not displayed]

FINDINGS: VASCULAR

Aorta: Normal caliber aorta without aneurysm, dissection, vasculitis
or significant stenosis.

Celiac: Patent without evidence of aneurysm, dissection, vasculitis
or significant stenosis.

SMA: Patent without evidence of aneurysm, dissection, vasculitis or
significant stenosis.

Renals: 3 right and 3 left renal arteries, all of which are patent.
No evidence of aneurysm, dissection, vasculitis or fibromuscular
dysplasia.

IMA: Patent without evidence of aneurysm, dissection, vasculitis or
significant stenosis.

RIGHT Lower Extremity

Inflow: Common, internal and external iliac arteries are patent
without evidence of aneurysm, dissection, vasculitis or significant
stenosis. Mild atherosclerosis of the right common iliac artery.

Outflow: Common, superficial and profunda femoral arteries and the
popliteal artery are patent without evidence of aneurysm,
dissection, vasculitis or significant stenosis.

Runoff: Patent three vessel runoff to the ankle.

LEFT Lower Extremity

Inflow: Common, internal and external iliac arteries are patent
without evidence of aneurysm, dissection, vasculitis or significant
stenosis.

Outflow: Common, superficial and profunda femoral arteries and the
popliteal artery are patent without evidence of aneurysm,
dissection, vasculitis or significant stenosis.

Runoff: Patent three vessel runoff to the ankle.

Veins: Venous structures not well assessed on this dedicated
arterial phase exam. Particularly, cannot assess for DVT.

Review of the MIP images confirms the above findings.

NON-VASCULAR

Lower chest: Atelectasis in both lower lobes.  No pleural fluid.

Hepatobiliary: Subcentimeter enhancing focus in the right lobe of
the liver nonspecific, may be an AP shunt or flash filling
hemangioma. Subcentimeter cyst in the left hepatic lobe. Arterial
phase exam not tailored for detailed hepatic evaluation. Gallbladder
physiologically distended, no calcified stone. No biliary
dilatation.

Pancreas: No ductal dilatation or inflammation.

Spleen: Normal arterial phase enhancement.  Normal in size.

Adrenals/Urinary Tract: No adrenal nodule. Extrarenal pelvis
configuration on the right. No hydronephrosis. No perinephric edema.
Curvilinear hyperdensity in the region of the right urinary trigone,
mild diffuse bladder wall thickening.

Stomach/Bowel: Bowel assessment is limited in the absence of enteric
contrast, arterial phase imaging, paucity of intra-abdominal fat.
Allowing for this, no evidence of bowel wall thickening or
inflammatory change. Moderate stool throughout the colon.

Lymphatic: Assessment for adenopathy is limited given arterial phase
imaging and paucity of intra-abdominal fat. There is right inguinal
adenopathy measuring up to 16 mm. Prominent nodes in the left
inguinal region. Possible left periaortic adenopathy measuring 12 mm
short axis, alternatively this may represent venous structures.

Reproductive: Prominent prostate gland spanning 5.7 cm. Diffuse
edema and thickening of the scrotal skin. No soft tissue air.

Other: Diffuse skin thickening and subcutaneous edema of the
subcutaneous tissues of the right lower extremity, extending from
the thigh through the foot. No obvious intramuscular fluid
collection.. Prominent right popliteal node measures 10 mm.

Musculoskeletal: Bilateral L5 pars interarticularis defects with
grade 2 anterolisthesis of L5 on S1.
IMPRESSION: VASCULAR

1. No acute arterial abnormality.  Only minimal atherosclerosis.
2. If there is clinical concern for DVT as stated in history,
recommend sonographic evaluation with duplex. Veins are not assessed
on this arterial phase exam.

NON-VASCULAR

1. Diffuse skin thickening and subcutaneous edema of the right lower
extremity. Diffuse scrotal skin thickening and edema. Findings are
nonspecific, favor infectious.
2. Inguinal and right popliteal adenopathy, presumably reactive.
Equivocal left periaortic adenopathy versus retroperitoneal venous
structures, suboptimally assessed.
3. Mild bladder wall thickening, nonspecific. Curvilinear high
density in the region of the right bladder trigone may be stones in
the distal ureter or bladder, bladder wall calcifications, or
prostatic calcifications.
4. Bilateral L5 pars interarticularis defects with grade 2
anterolisthesis of L5 on S1.

## 2019-10-17 DIAGNOSIS — E039 Hypothyroidism, unspecified: Secondary | ICD-10-CM | POA: Diagnosis not present

## 2019-10-17 DIAGNOSIS — D630 Anemia in neoplastic disease: Secondary | ICD-10-CM | POA: Diagnosis not present

## 2019-10-17 DIAGNOSIS — Z8052 Family history of malignant neoplasm of bladder: Secondary | ICD-10-CM | POA: Diagnosis not present

## 2019-10-17 DIAGNOSIS — C778 Secondary and unspecified malignant neoplasm of lymph nodes of multiple regions: Secondary | ICD-10-CM | POA: Diagnosis not present

## 2019-10-17 DIAGNOSIS — Z79899 Other long term (current) drug therapy: Secondary | ICD-10-CM | POA: Diagnosis not present

## 2019-10-17 DIAGNOSIS — Z79890 Hormone replacement therapy (postmenopausal): Secondary | ICD-10-CM | POA: Diagnosis not present

## 2019-10-17 DIAGNOSIS — C678 Malignant neoplasm of overlapping sites of bladder: Secondary | ICD-10-CM | POA: Diagnosis not present

## 2019-10-17 DIAGNOSIS — Z86718 Personal history of other venous thrombosis and embolism: Secondary | ICD-10-CM | POA: Diagnosis not present

## 2019-10-17 DIAGNOSIS — Z87891 Personal history of nicotine dependence: Secondary | ICD-10-CM | POA: Diagnosis not present

## 2019-10-17 DIAGNOSIS — R5383 Other fatigue: Secondary | ICD-10-CM | POA: Diagnosis not present

## 2019-10-17 DIAGNOSIS — Z7901 Long term (current) use of anticoagulants: Secondary | ICD-10-CM | POA: Diagnosis not present

## 2019-12-05 DIAGNOSIS — Z936 Other artificial openings of urinary tract status: Secondary | ICD-10-CM | POA: Diagnosis not present

## 2019-12-26 DIAGNOSIS — E039 Hypothyroidism, unspecified: Secondary | ICD-10-CM | POA: Diagnosis not present

## 2019-12-27 ENCOUNTER — Encounter (HOSPITAL_COMMUNITY)
Admission: EM | Disposition: A | Discharge status: Home / Self Care | Payer: Self-pay | Source: Home / Self Care | Attending: Cardiology

## 2019-12-27 ENCOUNTER — Emergency Department (HOSPITAL_COMMUNITY): Payer: BC Managed Care – PPO

## 2019-12-27 ENCOUNTER — Other Ambulatory Visit: Payer: Self-pay

## 2019-12-27 ENCOUNTER — Encounter (HOSPITAL_COMMUNITY): Payer: Self-pay

## 2019-12-27 ENCOUNTER — Inpatient Hospital Stay (HOSPITAL_COMMUNITY)
Admission: EM | Admit: 2019-12-27 | Discharge: 2019-12-27 | DRG: 286 | Disposition: A | Discharge status: Home / Self Care | Payer: BC Managed Care – PPO | Attending: Cardiology | Admitting: Cardiology

## 2019-12-27 DIAGNOSIS — Z888 Allergy status to other drugs, medicaments and biological substances status: Secondary | ICD-10-CM

## 2019-12-27 DIAGNOSIS — Z87891 Personal history of nicotine dependence: Secondary | ICD-10-CM | POA: Diagnosis not present

## 2019-12-27 DIAGNOSIS — Z8551 Personal history of malignant neoplasm of bladder: Secondary | ICD-10-CM

## 2019-12-27 DIAGNOSIS — Z20822 Contact with and (suspected) exposure to covid-19: Secondary | ICD-10-CM | POA: Diagnosis not present

## 2019-12-27 DIAGNOSIS — Z792 Long term (current) use of antibiotics: Secondary | ICD-10-CM

## 2019-12-27 DIAGNOSIS — Z79899 Other long term (current) drug therapy: Secondary | ICD-10-CM | POA: Diagnosis not present

## 2019-12-27 DIAGNOSIS — R079 Chest pain, unspecified: Secondary | ICD-10-CM | POA: Diagnosis not present

## 2019-12-27 DIAGNOSIS — Z79890 Hormone replacement therapy (postmenopausal): Secondary | ICD-10-CM

## 2019-12-27 DIAGNOSIS — E785 Hyperlipidemia, unspecified: Secondary | ICD-10-CM | POA: Diagnosis not present

## 2019-12-27 DIAGNOSIS — G062 Extradural and subdural abscess, unspecified: Secondary | ICD-10-CM | POA: Diagnosis present

## 2019-12-27 DIAGNOSIS — Z936 Other artificial openings of urinary tract status: Secondary | ICD-10-CM

## 2019-12-27 DIAGNOSIS — C679 Malignant neoplasm of bladder, unspecified: Secondary | ICD-10-CM | POA: Diagnosis not present

## 2019-12-27 DIAGNOSIS — I200 Unstable angina: Secondary | ICD-10-CM | POA: Diagnosis present

## 2019-12-27 DIAGNOSIS — I25110 Atherosclerotic heart disease of native coronary artery with unstable angina pectoris: Principal | ICD-10-CM | POA: Diagnosis present

## 2019-12-27 DIAGNOSIS — Z7901 Long term (current) use of anticoagulants: Secondary | ICD-10-CM

## 2019-12-27 DIAGNOSIS — I214 Non-ST elevation (NSTEMI) myocardial infarction: Secondary | ICD-10-CM

## 2019-12-27 DIAGNOSIS — Z86718 Personal history of other venous thrombosis and embolism: Secondary | ICD-10-CM

## 2019-12-27 DIAGNOSIS — I890 Lymphedema, not elsewhere classified: Secondary | ICD-10-CM | POA: Diagnosis present

## 2019-12-27 HISTORY — DX: Malignant (primary) neoplasm, unspecified: C80.1

## 2019-12-27 HISTORY — PX: LEFT HEART CATH AND CORONARY ANGIOGRAPHY: CATH118249

## 2019-12-27 HISTORY — PX: CARDIAC CATHETERIZATION: SHX172

## 2019-12-27 LAB — BASIC METABOLIC PANEL
Anion gap: 9 (ref 5–15)
BUN: 25 mg/dL — ABNORMAL HIGH (ref 8–23)
CO2: 26 mmol/L (ref 22–32)
Calcium: 8.8 mg/dL — ABNORMAL LOW (ref 8.9–10.3)
Chloride: 103 mmol/L (ref 98–111)
Creatinine, Ser: 1.12 mg/dL (ref 0.61–1.24)
GFR calc non Af Amer: >60 mL/min (ref >60)
Glucose, Bld: 100 mg/dL — ABNORMAL HIGH (ref 70–99)
Potassium: 3.4 mmol/L — ABNORMAL LOW (ref 3.5–5.1)
Sodium: 138 mmol/L (ref 135–145)

## 2019-12-27 LAB — PROTIME-INR
INR: 1.1 (ref 0.8–1.2)
Prothrombin Time: 13.8 seconds (ref 11.4–15.2)

## 2019-12-27 LAB — LIPID PANEL
Cholesterol: 151 mg/dL (ref 0–200)
HDL: 45 mg/dL (ref >40)
LDL Cholesterol: 90 mg/dL (ref 0–99)
Total CHOL/HDL Ratio: 3.4 RATIO
Triglycerides: 82 mg/dL (ref <150)
VLDL: 16 mg/dL (ref 0–40)

## 2019-12-27 LAB — RESPIRATORY PANEL BY RT PCR (FLU A&B, COVID)
Influenza A by PCR: NEGATIVE
Influenza B by PCR: NEGATIVE
SARS Coronavirus 2 by RT PCR: NEGATIVE

## 2019-12-27 LAB — TROPONIN I (HIGH SENSITIVITY)
Troponin I (High Sensitivity): 24 ng/L — ABNORMAL HIGH (ref <18)
Troponin I (High Sensitivity): 18 ng/L — ABNORMAL HIGH (ref <18)

## 2019-12-27 LAB — CBC
HCT: 43.8 % (ref 39.0–52.0)
Hemoglobin: 13.8 g/dL (ref 13.0–17.0)
MCH: 25.6 pg — ABNORMAL LOW (ref 26.0–34.0)
MCHC: 31.5 g/dL (ref 30.0–36.0)
MCV: 81.3 fL (ref 80.0–100.0)
Platelets: 299 10*3/uL (ref 150–400)
RBC: 5.39 MIL/uL (ref 4.22–5.81)
RDW: 17.6 % — ABNORMAL HIGH (ref 11.5–15.5)
WBC: 7.7 10*3/uL (ref 4.0–10.5)
nRBC: 0 % (ref 0.0–0.2)

## 2019-12-27 LAB — APTT: aPTT: 29 seconds (ref 24–36)

## 2019-12-27 LAB — HEPARIN LEVEL (UNFRACTIONATED): Heparin Unfractionated: 0.73 IU/mL — ABNORMAL HIGH (ref 0.30–0.70)

## 2019-12-27 SURGERY — LEFT HEART CATH AND CORONARY ANGIOGRAPHY
Anesthesia: LOCAL

## 2019-12-27 MED ORDER — SODIUM CHLORIDE 0.9 % IV SOLN: 250.0000 mL | INTRAVENOUS | Status: DC | PRN

## 2019-12-27 MED ORDER — CLOPIDOGREL BISULFATE 75 MG PO TABS
300.0000 mg | ORAL_TABLET | Freq: Once | ORAL | Status: AC
  Administered 2019-12-27: 300 mg via ORAL
  Filled 2019-12-27: qty 4

## 2019-12-27 MED ORDER — NITROGLYCERIN 1 MG/10 ML FOR IR/CATH LAB
INTRA_ARTERIAL | Status: DC | PRN
  Administered 2019-12-27: 200 ug

## 2019-12-27 MED ORDER — MIDAZOLAM HCL 2 MG/2ML IJ SOLN
INTRAMUSCULAR | Status: DC | PRN
  Administered 2019-12-27: 2 mg via INTRAVENOUS

## 2019-12-27 MED ORDER — NITROGLYCERIN 1 MG/10 ML FOR IR/CATH LAB
INTRA_ARTERIAL | Status: AC
  Filled 2019-12-27: qty 10

## 2019-12-27 MED ORDER — SODIUM CHLORIDE 0.9 % WEIGHT BASED INFUSION
1.0000 mL/kg/h | INTRAVENOUS | Status: AC
  Administered 2019-12-27: 1 mL/kg/h via INTRAVENOUS

## 2019-12-27 MED ORDER — NITROGLYCERIN 0.4 MG SL SUBL: 0.4000 mg | SUBLINGUAL_TABLET | SUBLINGUAL | Status: DC | PRN

## 2019-12-27 MED ORDER — LIDOCAINE HCL (PF) 1 % IJ SOLN
INTRAMUSCULAR | Status: DC | PRN
  Administered 2019-12-27: 2 mL

## 2019-12-27 MED ORDER — METOPROLOL TARTRATE 25 MG PO TABS
25.0000 mg | ORAL_TABLET | Freq: Two times a day (BID) | ORAL | 2 refills | Status: DC
Start: 2019-12-27 — End: 2020-02-18

## 2019-12-27 MED ORDER — ASPIRIN 81 MG PO CHEW: 81.0000 mg | CHEWABLE_TABLET | Freq: Every day | ORAL | Status: DC

## 2019-12-27 MED ORDER — SODIUM CHLORIDE 0.9% FLUSH: 3.0000 mL | Freq: Two times a day (BID) | INTRAVENOUS | Status: DC

## 2019-12-27 MED ORDER — MIDAZOLAM HCL 2 MG/2ML IJ SOLN
INTRAMUSCULAR | Status: AC
  Filled 2019-12-27: qty 2

## 2019-12-27 MED ORDER — ONDANSETRON HCL 4 MG/2ML IJ SOLN: 4.0000 mg | Freq: Four times a day (QID) | INTRAMUSCULAR | Status: DC | PRN

## 2019-12-27 MED ORDER — HEPARIN (PORCINE) 25000 UT/250ML-% IV SOLN
1200.0000 [IU]/h | INTRAVENOUS | Status: DC
  Administered 2019-12-27: 1200 [IU]/h via INTRAVENOUS
  Filled 2019-12-27: qty 250

## 2019-12-27 MED ORDER — FENTANYL CITRATE (PF) 100 MCG/2ML IJ SOLN
INTRAMUSCULAR | Status: AC
  Filled 2019-12-27: qty 2

## 2019-12-27 MED ORDER — LIDOCAINE HCL (PF) 1 % IJ SOLN
INTRAMUSCULAR | Status: AC
  Filled 2019-12-27: qty 30

## 2019-12-27 MED ORDER — HEPARIN (PORCINE) IN NACL 1000-0.9 UT/500ML-% IV SOLN
INTRAVENOUS | Status: AC
  Filled 2019-12-27: qty 1000

## 2019-12-27 MED ORDER — ASPIRIN 81 MG PO CHEW
324.0000 mg | CHEWABLE_TABLET | Freq: Once | ORAL | Status: AC
  Administered 2019-12-27: 324 mg via ORAL
  Filled 2019-12-27: qty 4

## 2019-12-27 MED ORDER — SODIUM CHLORIDE 0.9% FLUSH
3.0000 mL | Freq: Two times a day (BID) | INTRAVENOUS | Status: DC
  Administered 2019-12-27: 3 mL via INTRAVENOUS

## 2019-12-27 MED ORDER — SODIUM CHLORIDE 0.9% FLUSH: 3.0000 mL | INTRAVENOUS | Status: DC | PRN

## 2019-12-27 MED ORDER — FENTANYL CITRATE (PF) 100 MCG/2ML IJ SOLN
INTRAMUSCULAR | Status: DC | PRN
Start: 2019-12-27 — End: 2019-12-27
  Administered 2019-12-27: 25 ug via INTRAVENOUS

## 2019-12-27 MED ORDER — HEPARIN (PORCINE) IN NACL 1000-0.9 UT/500ML-% IV SOLN
INTRAVENOUS | Status: DC | PRN
  Administered 2019-12-27 (×2): 500 mL

## 2019-12-27 MED ORDER — ATORVASTATIN CALCIUM 80 MG PO TABS
80.0000 mg | ORAL_TABLET | Freq: Every day | ORAL | Status: DC
  Administered 2019-12-27: 80 mg via ORAL
  Filled 2019-12-27: qty 2

## 2019-12-27 MED ORDER — NITROGLYCERIN 0.4 MG SL SUBL: 0.4000 mg | SUBLINGUAL_TABLET | SUBLINGUAL | 1 refills | Status: AC | PRN

## 2019-12-27 MED ORDER — ATORVASTATIN CALCIUM 80 MG PO TABS: 80.0000 mg | ORAL_TABLET | Freq: Every day | ORAL | 2 refills | Status: DC

## 2019-12-27 MED ORDER — HEPARIN SODIUM (PORCINE) 1000 UNIT/ML IJ SOLN
INTRAMUSCULAR | Status: AC
  Filled 2019-12-27: qty 1

## 2019-12-27 MED ORDER — HEPARIN SODIUM (PORCINE) 1000 UNIT/ML IJ SOLN
INTRAMUSCULAR | Status: DC | PRN
  Administered 2019-12-27: 6000 [IU] via INTRAVENOUS

## 2019-12-27 MED ORDER — IOHEXOL 350 MG/ML SOLN
INTRAVENOUS | Status: DC | PRN
  Administered 2019-12-27: 45 mL

## 2019-12-27 MED ORDER — ACETAMINOPHEN 325 MG PO TABS: 650.0000 mg | ORAL_TABLET | ORAL | Status: DC | PRN

## 2019-12-27 MED ORDER — SODIUM CHLORIDE 0.9 % WEIGHT BASED INFUSION: 1.0000 mL/kg/h | INTRAVENOUS | Status: DC

## 2019-12-27 MED ORDER — VERAPAMIL HCL 2.5 MG/ML IV SOLN
INTRAVENOUS | Status: DC | PRN
  Administered 2019-12-27: 5 mL via INTRA_ARTERIAL

## 2019-12-27 MED ORDER — SODIUM CHLORIDE 0.9 % WEIGHT BASED INFUSION: 3.0000 mL/kg/h | INTRAVENOUS | Status: DC

## 2019-12-27 MED ORDER — LEVOTHYROXINE SODIUM 112 MCG PO TABS
112.0000 ug | ORAL_TABLET | Freq: Every morning | ORAL | Status: DC
  Administered 2019-12-27: 112 ug via ORAL
  Filled 2019-12-27: qty 1

## 2019-12-27 MED ORDER — CLOPIDOGREL BISULFATE 75 MG PO TABS: 75.0000 mg | ORAL_TABLET | Freq: Every day | ORAL | 2 refills | Status: DC

## 2019-12-27 MED ORDER — ASPIRIN EC 81 MG PO TBEC: 81.0000 mg | DELAYED_RELEASE_TABLET | Freq: Every day | ORAL | Status: DC

## 2019-12-27 MED ORDER — VERAPAMIL HCL 2.5 MG/ML IV SOLN
INTRAVENOUS | Status: AC
  Filled 2019-12-27: qty 2

## 2019-12-27 MED ORDER — METOPROLOL TARTRATE 25 MG PO TABS
25.0000 mg | ORAL_TABLET | Freq: Two times a day (BID) | ORAL | Status: DC
  Administered 2019-12-27 (×2): 25 mg via ORAL
  Filled 2019-12-27 (×2): qty 1

## 2019-12-27 MED ORDER — HEPARIN BOLUS VIA INFUSION
4000.0000 [IU] | Freq: Once | INTRAVENOUS | Status: AC
  Administered 2019-12-27: 4000 [IU] via INTRAVENOUS
  Filled 2019-12-27: qty 4000

## 2019-12-27 MED FILL — NITROGLYCERIN 0.4 MG TAB SL: 0.4 | 8 days supply | Qty: 25 | Fill #0

## 2019-12-27 MED FILL — ATORVASTATIN CALCIUM 80 MG: 80 | 30 days supply | Qty: 30 | Fill #0

## 2019-12-27 MED FILL — CLOPIDOGREL 75 MG TABLET: 75 | 30 days supply | Qty: 30 | Fill #0

## 2019-12-27 MED FILL — METOPROLOL TARTRATE 25 MG T: 25 | 30 days supply | Qty: 60 | Fill #0

## 2019-12-27 SURGICAL SUPPLY — 9 items
CATH OPTITORQUE TIG 4.0 5F (CATHETERS) ×1 IMPLANT
DEVICE RAD COMP TR BAND LRG (VASCULAR PRODUCTS) ×1 IMPLANT
GLIDESHEATH SLEND A-KIT 6F 22G (SHEATH) ×1 IMPLANT
GUIDEWIRE INQWIRE 1.5J.035X260 (WIRE) IMPLANT
INQWIRE 1.5J .035X260CM (WIRE) ×2
KIT HEART LEFT (KITS) ×2 IMPLANT
PACK CARDIAC CATHETERIZATION (CUSTOM PROCEDURE TRAY) ×2 IMPLANT
TRANSDUCER W/STOPCOCK (MISCELLANEOUS) ×2 IMPLANT
TUBING CIL FLEX 10 FLL-RA (TUBING) ×2 IMPLANT

## 2019-12-27 NOTE — Progress Notes (Signed)
ANTICOAGULATION CONSULT NOTE - Initial Consult  Pharmacy Consult for Heparin (Xarelto PTA for hx DVT) Indication: chest pain/ACS  No Known Allergies  Patient Measurements: Height: 5\' 8"  (172.7 cm) Weight: 87.5 kg (193 lb) IBW/kg (Calculated) : 68.4 Heparin Dosing Weight: 86 kg  Vital Signs: Temp: 98.5 F (36.9 C) (10/08 0648) Temp Source: Oral (10/08 0648) BP: 136/87 (10/08 0745) Pulse Rate: 73 (10/08 0745)  Labs: Recent Labs    12/27/19 0647  HGB 13.8  HCT 43.8  PLT 299  LABPROT 13.8  INR 1.1  CREATININE 1.12  TROPONINIHS 24*    Estimated Creatinine Clearance: 71.6 mL/min (by C-G formula based on SCr of 1.12 mg/dL).   Medical History: Past Medical History:  Diagnosis Date  . Cancer (HCC)     Medications:  Scheduled:  . aspirin  324 mg Oral Once   Infusions:   PRN:   Assessment: 64 yo male presents with exertional chest pain, troponin 24.  He takes Xarelto 20mg  daily for hx DVT - last dose taken 10/7 at [facility].  Pharmacy consulted to convert to IV heparin for planned cardiac cath today at [facility].   Baseline heparin level likely elevated with recent DOAC use  PT/INR wnl  CBC wnl  SCr 1.12, CrCl ~70 ml/min  Goal of Therapy:  Heparin level 0.3-0.7 units/ml Monitor platelets by anticoagulation protocol: Yes   Plan:   Heparin bolus 4000 units  Heparin IV infusion 1200 units/hr  Check aPTT and heparin level 6hrs after starting  Follow up anticoagulation plans after cardiac cath  Peggyann Juba, PharmD, [place]: [phone] 12/27/2019,8:30 AM

## 2019-12-27 NOTE — ED Provider Notes (Addendum)
[facility] DEPT Provider Note   CSN: [id number] Arrival date & time: 12/27/19  6063     History Chief Complaint  Patient presents with  . Chest Pain    Robert Peters. is a 64 y.o. male.  64 year old male presents with recurrent substernal chest discomfort times several weeks that is worse over the last 24 hours.  States that symptoms are exertional and better with rest.  There are no rating to his left arm.  They are not associate with dyspnea or diaphoresis.  The last for approximately 5 to 10 minutes.  He is currently asymptomatic.  Denies any prior cardiac evaluation.        Past Medical History:  Diagnosis Date  . Cancer ([place])     There are no problems to display for this patient.   History reviewed. No pertinent surgical history.     No family history on file.  Social History   Tobacco Use  . Smoking status: Never Smoker  . Smokeless tobacco: Never Used  Vaping Use  . Vaping Use: Never used  Substance Use Topics  . Alcohol use: Never  . Drug use: Never    Home Medications Prior to Admission medications   Medication Sig Start Date End Date Taking? Authorizing Provider  doxycycline (VIBRAMYCIN) 100 MG capsule Take 1 capsule (100 mg total) by mouth 2 (two) times daily. 10/19/17   Rancour, Annie Main, MD  FERROUS SULFATE PO Take 1 tablet by mouth daily.    [provider]  Multiple Vitamin (MULTIVITAMIN WITH MINERALS) TABS tablet Take 1 tablet by mouth daily.    [provider]  Multiple Vitamins-Minerals (ZINC PO) Take 1 tablet by mouth daily.    [provider]  Probiotic Product (PROBIOTIC PO) Take 1 capsule by mouth daily.    [provider]  Rivaroxaban 15 & 20 MG TBPK Take as directed on package: Start with one 15mg  tablet by mouth twice a day with food. On Day 22, switch to one 20mg  tablet once a day with food. 10/19/17   McDonald, Mia A, PA-C  VITAMIN E PO Take 1 capsule by mouth  daily.    [provider]    Allergies    Patient has no known allergies.  Review of Systems   Review of Systems  All other systems reviewed and are negative.   Physical Exam Updated Vital Signs BP (!) 170/98 (BP Location: Left Arm)   Pulse 93   Temp 98.5 F (36.9 C) (Oral)   Resp 20   SpO2 98%   Physical Exam Vitals and nursing note reviewed.  Constitutional:      General: He is not in acute distress.    Appearance: Normal appearance. He is well-developed. He is not toxic-appearing.  HENT:     Head: Normocephalic and atraumatic.  Eyes:     General: Lids are normal.     Conjunctiva/sclera: Conjunctivae normal.     Pupils: Pupils are equal, round, and reactive to light.  Neck:     Thyroid: No thyroid mass.     Trachea: No tracheal deviation.  Cardiovascular:     Rate and Rhythm: Normal rate and regular rhythm.     Heart sounds: Normal heart sounds. No murmur heard.  No gallop.   Pulmonary:     Effort: Pulmonary effort is normal. No respiratory distress.     Breath sounds: Normal breath sounds. No stridor. No decreased breath sounds, wheezing, rhonchi or rales.  Abdominal:  General: Bowel sounds are normal. There is no distension.     Palpations: Abdomen is soft.     Tenderness: There is no abdominal tenderness. There is no rebound.  Musculoskeletal:        General: No tenderness. Normal range of motion.     Cervical back: Normal range of motion and neck supple.  Skin:    General: Skin is warm and dry.     Findings: No abrasion or rash.  Neurological:     Mental Status: He is alert and oriented to person, place, and time.     GCS: GCS eye subscore is 4. GCS verbal subscore is 5. GCS motor subscore is 6.     Cranial Nerves: No cranial nerve deficit.     Sensory: No sensory deficit.  Psychiatric:        Speech: Speech normal.        Behavior: Behavior normal.     ED Results / Procedures / Treatments   Labs (all labs ordered are listed, but only  abnormal results are displayed) Labs Reviewed  CBC - Abnormal; Notable for the following components:      Result Value   MCH 25.6 (*)    RDW 17.6 (*)    All other components within normal limits  PROTIME-INR  BASIC METABOLIC PANEL  TROPONIN I (HIGH SENSITIVITY)    EKG EKG Interpretation  Date/Time:  Friday December 27 2019 06:50:11 EDT Ventricular Rate:  87 PR Interval:    QRS Duration: 109 QT Interval:  369 QTC Calculation: 444 R Axis:   69 Text Interpretation: Sinus rhythm Nonspecific T abnormalities, lateral leads No significant change was found Confirmed by Gerlene Fee [phone]) on 12/27/2019 7:01:51 AM Also confirmed by Lacretia Leigh ([id number])  on 12/27/2019 7:05:09 AM   Radiology No results found.  Procedures Procedures (including critical care time)  Medications Ordered in ED Medications - No data to display  ED Course  I have reviewed the triage vital signs and the nursing notes.  Pertinent labs & imaging results that were available during my care of the patient were reviewed by me and considered in my medical decision making (see chart for details).    MDM Rules/Calculators/A&P                          Patient's first troponin noted and is elevated.  Given aspirin here.  He has not taken his Xarelto today.  With likely NSTEMI and will consult cardiology for admission  8:21 AM Discussed case with Dr. Einar Gip who request that patient be given heparin and aspirin and transferred to telemetry at [facility]  He would likely do a catheterization later today Final Clinical Impression(s) / ED Diagnoses Final diagnoses:  None    Rx / DC Orders ED Discharge Orders    None       Lacretia Leigh, MD 12/27/19 2549    Lacretia Leigh, MD 12/27/19 [phone]

## 2019-12-27 NOTE — Progress Notes (Signed)
Received Pt with TR band still in place with 2cc balloon left.   Deflated 2cc, dressing placed and monitored. No bleeding, no bruise, no swelling noted on the site. VS stable. Questions answered. Pt getting ready for discharge.   Pt alert and oriented, no complains of pain. Denies dizziness. Pt ready to go home.

## 2019-12-27 NOTE — ED Notes (Signed)
Patient transported to X-ray 

## 2019-12-27 NOTE — Interval H&P Note (Signed)
History and Physical Interval Note:  12/27/2019 3:04 PM  Robert Peters.  has presented today for surgery, with the diagnosis of NSTEMI.  The various methods of treatment have been discussed with the patient and family. After consideration of risks, benefits and other options for treatment, the patient has consented to  Procedure(s): LEFT HEART CATH AND CORONARY ANGIOGRAPHY and possible angioplasty (N/A) and possible angioplasty as a surgical intervention.  The patient's history has been reviewed, patient examined, no change in status, stable for surgery.  I have reviewed the patient's chart and labs.  Questions were answered to the patient's satisfaction.   Cath Lab Visit (complete for each Cath Lab visit)  Clinical Evaluation Leading to the Procedure:   ACS: Yes.    Non-ACS:    Anginal Classification: CCS IV  Anti-ischemic medical therapy: No Therapy  Non-Invasive Test Results: No non-invasive testing performed  Prior CABG: No previous CABG  Adrian Prows

## 2019-12-27 NOTE — H&P (Signed)
CARDIOLOGY ADMIT NOTE   Patient ID: Yousaf C Alonna Minium. MRN: [id number] DOB/AGE: 08/16/1955 64 y.o.  Admit date: 12/27/2019 Primary Physician:  [name], MD  Patient ID: Ricard Dillon., male    DOB: 03-21-1956, 64 y.o.   MRN: [id number]  Chief Complaint  Patient presents with  . Chest Pain   HPI:    Acea C Camillo Quadros.  is a 64 y.o. Caucasian male with history of bladder cancer, SP radical cystectomy and has a urostomy bag performed in 2019, initial presentation with acute DVT involving his right leg and was started on Xarelto on 10/18/2017.  He then had hematuria and eventually led to the diagnosis of bladder cancer.  He is now being treated for bladder cancer at [facility].  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  Patient was in his usual state of health until a month ago started having exertional chest tightness.  Episodes continue to progress.  Over the past 3 days started having frequent episodes and yesterday had 5 episodes of angina with exertional activity and relieved with rest.  And walking distance has significantly reduced and yesterday was much more prolonged episode for lasting 15 to 20 minutes with radiation to his left arm and he felt that he needed to be evaluated further and presented to the emergency room.  Since being in the hospital, he is chest pain-free.  Past Medical History:  Diagnosis Date  . Cancer [facility])    Prostate and Bladder Ca   Past Surgical History:  Procedure Laterality Date  . Cystectomy with Ileal Conduit  12/2017  . CYSTOSCOPY  11/2017  . Nephrostomy tube with Drain  12/2018  . Percutaneous Nephrosotomy Tube  2019  . Radical Prostatectomy  02/2019  . WISDOM TOOTH EXTRACTION     Social History   Tobacco Use  . Smoking status: Former Smoker    Packs/day: 1.00    Types: Cigarettes    Quit date:  12/26/1973    Years since quitting: 46.0  . Smokeless tobacco: Never Used  Substance Use Topics  . Alcohol use: Never  Marital Status: Married   ROS  Review of Systems  Cardiovascular: Positive for chest pain. Negative for dyspnea on exertion and leg swelling.  Musculoskeletal: Positive for back pain.  Gastrointestinal: Negative for melena.  All other systems reviewed and are negative.  Objective   Vitals with BMI 12/27/2019 12/27/2019 12/27/2019  Height - - 5\' 8"   Weight - - 193 lbs  BMI - - 19.62  Systolic 229 798 -  Diastolic 99 92 -  Pulse 75 65 -    Physical Exam Constitutional:      Appearance: Normal appearance.  Eyes:     Conjunctiva/sclera: Conjunctivae normal.  Cardiovascular:     Rate and Rhythm: Normal rate and regular rhythm.     Pulses: Normal pulses and intact distal pulses.     Heart sounds: Normal heart sounds. No murmur heard.  No gallop.      Comments: 2+ bilateral leg edema involving right thigh and right leg with almost nonpitting lymphedema and left leg lymphedema below the knee, trace pitting present bilaterally. There is no JVD. Pulmonary:     Effort: Pulmonary effort is normal.     Breath sounds: Normal breath sounds.  Abdominal:     General: Bowel sounds are normal.     Palpations: Abdomen is soft.  Musculoskeletal:  Cervical back: Normal range of motion.  Skin:    General: Skin is warm and dry.  Neurological:     General: No focal deficit present.     Mental Status: He is alert.    Laboratory examination:   Recent Labs    12/27/19 0647  NA 138  K 3.4*  CL 103  CO2 26  GLUCOSE 100*  BUN 25*  CREATININE 1.12  CALCIUM 8.8*  GFRNONAA >60   estimated creatinine clearance is 71.6 mL/min (by C-G formula based on SCr of 1.12 mg/dL).  CMP Latest Ref Rng & Units 12/27/2019 10/19/2017  Glucose 70 - 99 mg/dL 100(H) 100(H)  BUN 8 - 23 mg/dL 25(H) 26(H)  Creatinine 0.61 - 1.24 mg/dL 1.12 0.95  Sodium 135 - 145 mmol/L 138 141  Potassium  3.5 - 5.1 mmol/L 3.4(L) 3.9  Chloride 98 - 111 mmol/L 103 103  CO2 22 - 32 mmol/L 26 26  Calcium 8.9 - 10.3 mg/dL 8.8(L) 8.9  Total Protein 6.5 - 8.1 g/dL - 7.7  Total Bilirubin 0.3 - 1.2 mg/dL - 0.7  Alkaline Phos 38 - 126 U/L - 54  AST 15 - 41 U/L - 26  ALT 0 - 44 U/L - 20   CBC Latest Ref Rng & Units 12/27/2019 10/19/2017  WBC 4.0 - 10.5 K/uL 7.7 7.3  Hemoglobin 13.0 - 17.0 g/dL 13.8 14.4  Hematocrit 39 - 52 % 43.8 43.8  Platelets 150 - 400 K/uL 299 184   Lipid Panel     Component Value Date/Time   CHOL 151 12/27/2019 0842   TRIG 82 12/27/2019 0842   HDL 45 12/27/2019 0842   CHOLHDL 3.4 12/27/2019 0842   VLDL 16 12/27/2019 0842   LDLCALC 90 12/27/2019 0842   HEMOGLOBIN A1C No results found for: HGBA1C, MPG TSH No results for input(s): TSH in the last 8760 hours. BNP (last 3 results) No results for input(s): BNP in the last 8760 hours.    Ref Range & Units 08:42 06:47  Troponin I (High Sensitivity) <18 ng/L 18High  24High CM        Medications and allergies   Allergies  Allergen Reactions  . Avelumab Other (See Comments)    Hypertension, shakes, chills, and creatine went up.    No current facility-administered medications on file prior to encounter.   Current Outpatient Medications on File Prior to Encounter  Medication Sig Dispense Refill  . acetaminophen (TYLENOL) 500 MG tablet Take 500-1,000 mg by mouth every 6 (six) hours as needed (back pain).    Marland Kitchen levothyroxine (SYNTHROID) 112 MCG tablet Take 112 mcg by mouth in the morning.     . rivaroxaban (XARELTO) 20 MG TABS tablet Take 20 mg by mouth in the morning.    . Rivaroxaban 15 & 20 MG TBPK Take as directed on package: Start with one 15mg  tablet by mouth twice a day with food. On Day 22, switch to one 20mg  tablet once a day with food. (Patient not taking: Reported on 12/27/2019) 51 each 0    . heparin 1,200 Units/hr (12/27/19 0945)    Current Outpatient Medications  Medication Instructions  .  acetaminophen (TYLENOL) 500-1,000 mg, Oral, Every 6 hours PRN  . levothyroxine (SYNTHROID) 112 mcg, Oral, Every morning  . rivaroxaban (XARELTO) 20 mg, Oral, Every morning  . Rivaroxaban 15 & 20 MG TBPK Take as directed on package: Start with one 15mg  tablet by mouth twice a day with food. On Day 22, switch to one 20mg   tablet once a day with food.   No intake/output data recorded. No intake/output data recorded.   Radiology:  DG Chest 2 View  Result Date: 12/27/2019 CLINICAL DATA:  Chest pain. EXAM: CHEST - 2 VIEW COMPARISON:  None. FINDINGS: The heart size and mediastinal contours are within normal limits. Both lungs are clear. No pneumothorax or pleural effusion is noted. The visualized skeletal structures are unremarkable. IMPRESSION: No active cardiopulmonary disease. Electronically Signed   By: Marijo Conception M.D.   On: 12/27/2019 08:10   Cardiac Studies:   Echocardiogram 07/22/2019: Normal LV systolic function, normal diastolic function, no significant valvular abnormality.  Trace AI, MR, TR and PR.  3D reconstruction was done as well.  EKG:  EKG 12/27/2019: Normal sinus rhythm with rate of 87 bpm, normal axis.  Nonspecific ST abnormalities laterally with nonspecific T abnormalities in the anterolateral leads.  Compared to 10/19/2017, ST-T wave changes new.  Assessment   1.  Unstable angina pectoris 2.  Mild hyperlipidemia 3.  History of DVT in July 2019 and has been on chronic Xarelto therapy, no recurrence.  Recommendations:   Rodolfo C Aeon Kessner.  is a  64 y.o. Caucasian male with history of bladder cancer, SP radical cystectomy and has a urostomy bag performed in 2019, initial presentation with acute DVT involving his right leg and was started on Xarelto on 10/18/2017.  He then had hematuria and eventually led to the diagnosis of bladder cancer.  He is now being treated for bladder cancer at [facility].  He also was diagnosed with Epidural abscess on  09/27/2019 and was treated with prolonged antibiotic course, transthoracic echocardiogram was normal.  Patient was in his usual state of health until a month ago started having exertional chest tightness.  Now he has been having frequent episodes of chest pain.  Patient has classic symptoms of unstable angina pectoris with mildly abnormal nonspecific EKG abnormality.  Troponins are minimally positive.  I will set him up for cardiac catheterization to evaluate his coronary anatomy. Schedule for cardiac catheterization, and possible angioplasty. We discussed regarding risks, benefits, alternatives to this including stress testing, CTA and continued medical therapy. Patient wants to proceed. Understands <1-2% risk of death, stroke, MI, urgent CABG, bleeding, infection, renal failure but not limited to these.  As patient is on Xarelto which he has not received today but overall would be at high risk for bleeding complication.  He will be able to come off of Xarelto but he will discuss this with his oncology colleagues at [facility].  Patient and his wife understand the risks and benefits and are willing to proceed.  I will make further recommendations after the catheterization.  I have started the patient on IV heparin and also high intensity high-dose statin and also beta-blocker therapy for now.    Adrian Prows, MD, [facility] 12/27/2019, 11:44 AM Office: [phone]

## 2019-12-27 NOTE — Discharge Summary (Signed)
Physician Discharge Summary  Patient ID: Robert Peters. MRN: [id number] DOB/AGE: 05-22-1955 64 y.o. Robert Amel, MD   Admit date: 12/27/2019 Discharge date: 12/27/2019  Primary Discharge Diagnosis 1.  Unstable angina pectoris 2.  Mild hyperlipidemia 3.  History of bladder cancer SP radical cystectomy being followed at [facility] 4.  Chronic lymphedema bilateral lower extremity 5.  History of DVT right lower extremity 2019 presently on long-term Xarelto  Significant Diagnostic Studies:  Left Heart Catheterization 12/27/19:  LV: Normal LV systolic function.  No regional wall motion abnormality.  Normal LVEDP.  No pressure gradient across the aortic valve. Left main: Normal. LAD: There is a ulcerated plaque in the mid LAD.  Nonflow limiting.  There is no contrast hung up.  Post nitroglycerin does not reveal any significant stenosis. Circumflex: Moderate sized vessel, mild proximal disease of 20%. Ramus intermediate: Mild disease in the proximal segment of 10 to 20%, mid segment has a 30% stenosis. RCA: Dominant.  There is a separate ostia for conus branch and AV nodal branch.  There is mild disease in the proximal RCA of 20%.  PDA ostium has about a 20 to 30% stenosis.  Impression: The culprit lesion appears to be mid LAD.  However there is very brisk flow and the ulceration does not appear to be any complex.  There is TIMI-3 flow noted.  Can be treated medically, can be discharged home with Xarelto (Prior DVT) starting tonight and add Plavix 75 mg daily.   He has been started on low-dose beta-blocker therapy along with high intensity high-dose atorvastatin at 80 mg daily, will continue the same.  55 mill contrast utilized.  EKG 12/27/2019: Normal sinus rhythm with rate of 87 bpm, normal axis.  Nonspecific ST abnormalities laterally with nonspecific T abnormalities in the anterolateral leads.  Compared to 10/19/2017, ST-T wave changes new.   Radiology: DG  Chest 2 View  Result Date: 12/27/2019 CLINICAL DATA:  Chest pain. EXAM: CHEST - 2 VIEW COMPARISON:  None. FINDINGS: The heart size and mediastinal contours are within normal limits. Both lungs are clear. No pneumothorax or pleural effusion is noted. The visualized skeletal structures are unremarkable. IMPRESSION: No active cardiopulmonary disease. Electronically Signed   By: Marijo Conception M.D.   On: 12/27/2019 08:10   Hospital Course: Robert Peters. is a 64 y.o. male  patient with no known coronary artery disease or cardiovascular disease admitted to the hospital with 3 to 4 weeks history of exertional angina but worse over the past 2 to 3 days now having rest pain and had mildly positive serum troponin and mildly abnormal EKG changes.  Given this he underwent several cardiac catheterization revealing an ulcerated mid LAD plaque which I felt can be treated medically.  He was bolused with Plavix 300 mg and will be discharged home on Plavix 75 mg daily along with Xarelto that he was on previously for DVT in 2019.  Recommendations on discharge: Patient since admission to the hospital has been completely asymptomatic.  The lesion in the LAD appears very stable.  Hence felt he could potentially be discharged same day.  Patient has been started on Plavix, continue Xarelto 20 mg in the evening after dinner, discuss with hematology and oncology at [facility] to see whether Xarelto can be discontinued and switched over to aspirin and Plavix in view of unstable angina pectoris and ACS.  He will be seen in our office in 2 weeks.  Discharge Exam: Vitals with  BMI 12/27/2019 12/27/2019 12/27/2019  Height - - -  Weight - - -  BMI - - -  Systolic 836 629 476  Diastolic 71 72 71  Pulse 55 61 63    Physical Exam Cardiovascular:     Rate and Rhythm: Normal rate and regular rhythm.     Pulses: Normal pulses and intact distal pulses.     Heart sounds: Normal heart sounds. No murmur heard.  No gallop.        Comments: Bilateral 2+ lymphedema with trace bilateral leg edema, pitting right worse than the left. Pulmonary:     Effort: Pulmonary effort is normal.     Breath sounds: Normal breath sounds.  Abdominal:     General: Bowel sounds are normal.     Palpations: Abdomen is soft.    Labs:   Lab Results  Component Value Date   WBC 7.7 12/27/2019   HGB 13.8 12/27/2019   HCT 43.8 12/27/2019   MCV 81.3 12/27/2019   PLT 299 12/27/2019    Recent Labs  Lab 12/27/19 0647  NA 138  K 3.4*  CL 103  CO2 26  BUN 25*  CREATININE 1.12  CALCIUM 8.8*  GLUCOSE 100*    Ref Range & Units 08:42 06:47  Troponin I (High Sensitivity) <18 ng/L 18High  24High CM        Lipid Panel     Component Value Date/Time   CHOL 151 12/27/2019 0842   TRIG 82 12/27/2019 0842   HDL 45 12/27/2019 0842   CHOLHDL 3.4 12/27/2019 0842   VLDL 16 12/27/2019 0842   LDLCALC 90 12/27/2019 0842   FOLLOW UP PLANS AND APPOINTMENTS  Allergies as of 12/27/2019      Reactions   Avelumab Other (See Comments)   Hypertension, shakes, chills, and creatine went up.      Medication List    TAKE these medications   acetaminophen 500 MG tablet Commonly known as: TYLENOL Take 500-1,000 mg by mouth every 6 (six) hours as needed (back pain).   atorvastatin 80 MG tablet Commonly known as: LIPITOR Take 1 tablet (80 mg total) by mouth daily. Start taking on: December 28, 2019   clopidogrel 75 MG tablet Commonly known as: Plavix Take 1 tablet (75 mg total) by mouth daily.   levothyroxine 112 MCG tablet Commonly known as: SYNTHROID Take 112 mcg by mouth in the morning.   metoprolol tartrate 25 MG tablet Commonly known as: LOPRESSOR Take 1 tablet (25 mg total) by mouth 2 (two) times daily.   nitroGLYCERIN 0.4 MG SL tablet Commonly known as: NITROSTAT Place 1 tablet (0.4 mg total) under the tongue every 5 (five) minutes x 3 doses as needed for chest pain.   rivaroxaban 20 MG Tabs tablet Commonly known  as: XARELTO Take 1 tablet (20 mg total) by mouth daily with supper. Start tonight on return to home What changed:   when to take this  additional instructions  Another medication with the same name was removed. Continue taking this medication, and follow the directions you see here.       Follow-up Information    Adrian Prows, MD. Call.   Specialty: Cardiology Why: To be seen in 2 weeks Contact information: [place] [address] [phone]                Adrian Prows, MD, [facility] 12/27/2019, 4:46 PM Office: [phone]

## 2019-12-27 NOTE — Progress Notes (Addendum)
Received patient from [facility] via [facility], alert and oriented X4 , skin warm and dry resp. even and unlabored.  Pt denies andy chest pain aor discomfort at this time.  IVF infusing, consent signed, and patient on monitor.  Patient waiting for cath procedure.

## 2019-12-27 NOTE — ED Triage Notes (Signed)
Patient arrived stating a few weeks ago he felt occasional central chest pain but yesterday began to worsen and radiate to the left shoulder. Declines any shortness of breath.

## 2019-12-27 NOTE — CV Procedure (Signed)
Left Heart Catheterization 12/27/19:          Ulcerated plaque in mid LAD 50%. Medical management.    Adrian Prows, MD, [facility] 12/27/2019, 3:43 PM Office: [phone]

## 2019-12-27 NOTE — ED Notes (Signed)
[facility] called. 

## 2019-12-30 ENCOUNTER — Encounter (HOSPITAL_COMMUNITY): Payer: Self-pay | Admitting: Cardiology

## 2020-01-10 ENCOUNTER — Encounter: Payer: Self-pay | Admitting: Cardiology

## 2020-01-10 ENCOUNTER — Other Ambulatory Visit: Payer: Self-pay

## 2020-01-10 ENCOUNTER — Ambulatory Visit: Payer: BC Managed Care – PPO | Admitting: Cardiology

## 2020-01-10 VITALS — BP 137/90 | HR 73 | Ht 68.0 in | Wt 199.0 lb

## 2020-01-10 DIAGNOSIS — K219 Gastro-esophageal reflux disease without esophagitis: Secondary | ICD-10-CM

## 2020-01-10 DIAGNOSIS — I25118 Atherosclerotic heart disease of native coronary artery with other forms of angina pectoris: Secondary | ICD-10-CM | POA: Diagnosis not present

## 2020-01-10 DIAGNOSIS — E7800 Pure hypercholesterolemia, unspecified: Secondary | ICD-10-CM | POA: Diagnosis not present

## 2020-01-10 DIAGNOSIS — Z8551 Personal history of malignant neoplasm of bladder: Secondary | ICD-10-CM | POA: Diagnosis not present

## 2020-01-10 MED ORDER — ISOSORBIDE MONONITRATE ER 30 MG PO TB24: 30.0000 mg | ORAL_TABLET | Freq: Every day | ORAL | 2 refills | Status: DC

## 2020-01-10 MED ORDER — NIZATIDINE 150 MG PO CAPS: 150.0000 mg | ORAL_CAPSULE | Freq: Every day | ORAL | 1 refills | Status: DC

## 2020-01-10 NOTE — Progress Notes (Signed)
Primary Physician/Referring:  [name], MD  Patient ID: Robert Peters., male    DOB: [birth year], 64 y.o.   MRN: [id number]  Chief Complaint  Patient presents with  . Coronary Artery Disease    hospital f/u post cath  . New Patient (Initial Visit)  . Chest Pain   HPI:    Robert Peters.  is a 64 y.o. Caucasian male with history of bladder cancer, SP radical cystectomy and has a urostomy bag performed in 2019, initial presentation with acute DVT involving his right leg and was started on Xarelto on 10/18/2017.  He then had hematuria and eventually led to the diagnosis of bladder cancer, now being treated for bladder cancer at [facility].  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  He presented on 12/27/2018 with exertional chest tightness and rest pain and was found to have positive cardiac markers for non-STEMI.  Cardiac risk factors include hyperlipidemia.  He underwent coronary angiography and recommended medical therapy with MID lad to be culprit and mild noncritical disease in other vessels.  He was discharged home the same day with Plavix and Xarelto on board.  Patient is presently doing well, continues to have exertional chest discomfort but he is able to continue to do his activities.  He has not resumed all his activities.  He has not had any other symptoms associated with chest discomfort.  Wife present.  Past Medical History:  Diagnosis Date  . Cancer [facility])    Prostate and Bladder Ca   Past Surgical History:  Procedure Laterality Date  . CARDIAC CATHETERIZATION  12/27/2019  . Cystectomy with Ileal Conduit  12/2017  . CYSTOSCOPY  11/2017  . LEFT HEART CATH AND CORONARY ANGIOGRAPHY N/A 12/27/2019   Procedure: LEFT HEART CATH AND CORONARY ANGIOGRAPHY and possible angioplasty;  Surgeon: Adrian Prows, MD;  Location: [facility];   Service: Cardiovascular;  Laterality: N/A;  . Nephrostomy tube with Drain  12/2018  . Percutaneous Nephrosotomy Tube  2019  . Radical Prostatectomy  02/2019  . WISDOM TOOTH EXTRACTION     Family History  Problem Relation Age of Onset  . Stroke Mother   . Hypertension Mother   . Valvular heart disease Father   . Hypertension Brother     Social History   Tobacco Use  . Smoking status: Former Smoker    Packs/day: 1.00    Types: Cigarettes    Quit date: 12/26/1973    Years since quitting: 46.0  . Smokeless tobacco: Never Used  Substance Use Topics  . Alcohol use: Never   Marital Status: Married  ROS  Review of Systems  Cardiovascular: Positive for chest pain. Negative for dyspnea on exertion and leg swelling.  Gastrointestinal: Negative for melena.   Objective  Blood pressure 137/90, pulse 73, height 5\' 8"  (1.727 m), weight 199 lb (90.3 kg), SpO2 96 %.  Vitals with BMI 01/10/2020 12/27/2019 12/27/2019  Height 5\' 8"  - -  Weight 199 lbs - -  BMI 22.02 - -  Systolic 542 706 237  Diastolic 90 74 80  Pulse 73 67 54     Physical Exam Cardiovascular:     Rate and Rhythm: Normal rate and regular rhythm.     Pulses: Intact distal pulses.     Heart sounds: Normal heart sounds. No murmur heard.  No gallop.      Comments: No leg edema,  no JVD. Pulmonary:     Effort: Pulmonary effort is normal.     Breath sounds: Normal breath sounds.  Abdominal:     General: Bowel sounds are normal.     Palpations: Abdomen is soft.  Genitourinary:    Comments: Urostomy present   Laboratory examination:   Recent Labs    12/27/19 0647  NA 138  K 3.4*  CL 103  CO2 26  GLUCOSE 100*  BUN 25*  CREATININE 1.12  CALCIUM 8.8*  GFRNONAA >60   estimated creatinine clearance is 72.8 mL/min (by C-G formula based on SCr of 1.12 mg/dL).  CMP Latest Ref Rng & Units 12/27/2019 10/19/2017  Glucose 70 - 99 mg/dL 100(H) 100(H)  BUN 8 - 23 mg/dL 25(H) 26(H)  Creatinine 0.61 - 1.24 mg/dL 1.12 0.95    Sodium 135 - 145 mmol/L 138 141  Potassium 3.5 - 5.1 mmol/L 3.4(L) 3.9  Chloride 98 - 111 mmol/L 103 103  CO2 22 - 32 mmol/L 26 26  Calcium 8.9 - 10.3 mg/dL 8.8(L) 8.9  Total Protein 6.5 - 8.1 g/dL - 7.7  Total Bilirubin 0.3 - 1.2 mg/dL - 0.7  Alkaline Phos 38 - 126 U/L - 54  AST 15 - 41 U/L - 26  ALT 0 - 44 U/L - 20   CBC Latest Ref Rng & Units 12/27/2019 10/19/2017  WBC 4.0 - 10.5 K/uL 7.7 7.3  Hemoglobin 13.0 - 17.0 g/dL 13.8 14.4  Hematocrit 39 - 52 % 43.8 43.8  Platelets 150 - 400 K/uL 299 184    Lipid Panel Recent Labs    12/27/19 0842  CHOL 151  TRIG 82  LDLCALC 90  VLDL 16  HDL 45  CHOLHDL 3.4   HEMOGLOBIN A1C No results found for: HGBA1C, MPG TSH No results for input(s): TSH in the last 8760 hours.  Medications and allergies   Allergies  Allergen Reactions  . Avelumab Other (See Comments)    Hypertension, shakes, chills, and creatine went up.     Outpatient Medications Prior to Visit  Medication Sig Dispense Refill  . acetaminophen (TYLENOL) 500 MG tablet Take 500-1,000 mg by mouth every 6 (six) hours as needed (back pain).    Marland Kitchen atorvastatin (LIPITOR) 80 MG tablet Take 1 tablet (80 mg total) by mouth daily. 30 tablet 2  . clopidogrel (PLAVIX) 75 MG tablet Take 1 tablet (75 mg total) by mouth daily. 30 tablet 2  . levothyroxine (SYNTHROID) 112 MCG tablet Take 112 mcg by mouth in the morning.     . metoprolol tartrate (LOPRESSOR) 25 MG tablet Take 1 tablet (25 mg total) by mouth 2 (two) times daily. 60 tablet 2  . nitroGLYCERIN (NITROSTAT) 0.4 MG SL tablet Place 1 tablet (0.4 mg total) under the tongue every 5 (five) minutes x 3 doses as needed for chest pain. 25 tablet 1  . rivaroxaban (XARELTO) 20 MG TABS tablet Take 1 tablet (20 mg total) by mouth daily with supper. Start tonight on return to home 30 tablet    No facility-administered medications prior to visit.    Radiology:   DG Chest 2 View Result Date: 12/27/2019 CLINICAL DATA:  Chest pain. EXAM:  CHEST - 2 VIEW COMPARISON:  None. FINDINGS: The heart size and mediastinal contours are within normal limits. Both lungs are clear. No pneumothorax or pleural effusion is noted. The visualized skeletal structures are unremarkable. IMPRESSION: No active cardiopulmonary disease. Electronically Signed   By: Marijo Conception M.D.   On: 12/27/2019 08:10  Cardiac Studies:   Echocardiogram 07/22/2019: Normal LV systolic function, normal diastolic function, no significant valvular abnormality.  Trace AI, MR, TR and PR.  3D reconstruction was done as well.  Left Heart Catheterization 12/27/19: LV: Normal LV systolic function. No regional wall motion abnormality. Normal LVEDP. No pressure gradient across the aortic valve. Left main: Normal. LAD: There is a ulcerated plaque in the mid LAD. Nonflow limiting. There is no contrast hung up. Post nitroglycerin does not reveal any significant stenosis. Circumflex: Moderate sized vessel, mild proximal disease of 20%. Ramus intermediate: Mild disease in the proximal segment of 10 to 20%, mid segment has a 30% stenosis. RCA: Dominant. There is a separate ostia for conus branch and AV nodal branch. There is mild disease in the proximal RCA of 20%. PDA ostium has about a 20 to 30% stenosis.  Impression: The culprit lesion appears to be mid LAD. However there is very brisk flow and the ulceration does not appear to be any complex. There is TIMI-3 flow noted. Can be treated medically, can be discharged home with Xarelto (Prior DVT) starting tonight and add Plavix 75 mg daily.   EKG:   EKG 01/10/2020: Normal sinus rhythm at rate of 69 bpm, normal axis.  Nonspecific T abnormality. EKG 12/27/2019: Normal sinus rhythm with rate of 87 bpm, normal axis. Nonspecific ST abnormalities laterally with nonspecific T abnormalities in the anterolateral leads. Compared to 10/19/2017, ST-T wave changes new.  Assessment     ICD-10-CM   1. Coronary artery disease of  native artery of native heart with stable angina pectoris (HCC)  I25.118 EKG 12-Lead    PCV ECHOCARDIOGRAM COMPLETE    isosorbide mononitrate (IMDUR) 30 MG 24 hr tablet  2. Hypercholesteremia  E78.00   3. History of bladder cancer S/P cystectomy and urostomy 2019.  Z85.51   4. Gastroesophageal reflux disease without esophagitis  K21.9 nizatidine (AXID) 150 MG capsule     There are no discontinued medications.  Meds ordered this encounter  Medications  . nizatidine (AXID) 150 MG capsule    Sig: Take 1 capsule (150 mg total) by mouth at bedtime.    Dispense:  30 capsule    Refill:  1  . isosorbide mononitrate (IMDUR) 30 MG 24 hr tablet    Sig: Take 1 tablet (30 mg total) by mouth daily.    Dispense:  30 tablet    Refill:  2    Recommendations:   Robert Peters. is a 64 y.o. Caucasian male with history of DVT left leg 2019, bladder cancer diagnosed due to hematuria following DVT and is being followed at [facility], sepsis with epidural abscess 09/27/2019 treated with prolonged antibiotics, echocardiogram normal at that time, hyperlipidemia and coronary artery disease with non-STEMI presentation on 12/27/2019 recommended medical therapy with MID lad to be culprit.  He presents for follow-up.  I reviewed his angiogram with the patient and his wife.  Mid LAD is about a 50% stenosis that is ulcerated with excellent TIMI-3 flow.  However he continues to have mild symptoms of angina, class II-III at most for now and I have added isosorbide mononitrate 30 mg daily.  He has not had any rest pain, EKG does not reveal any acute ischemic changes, advised him to continue to increase his physical activity, cardiac rehab will be performed, if he continues to have persistent symptoms I could certainly consider repeating cardiac catheterization and stenting to the mid LAD.  Continue high intensity statin.  He is  presently on Plavix along with Xarelto continue the same.  He will discuss  with his oncologist to see whether he can come off of Xarelto so I can start him on aspirin and Plavix combination of aspirin and Brilinta combination.  He is having some symptoms of heartburn, suspect probably related to Plavix. I will start him on H2 receptor antagonist.  Office visit in 6 weeks.  I will also obtain an echocardiogram prior to his office visit.  This is a 40-minute encounter with the patient and his wife with discussions regarding coronary artery disease, management of chronic stable angina, lipids.   Adrian Prows, MD, [facility] 01/10/2020, 9:28 AM Office: [phone]

## 2020-01-13 ENCOUNTER — Ambulatory Visit: Payer: BC Managed Care – PPO | Admitting: Cardiology

## 2020-01-14 ENCOUNTER — Encounter (HOSPITAL_COMMUNITY): Payer: Self-pay | Admitting: *Deleted

## 2020-01-14 NOTE — Progress Notes (Signed)
Received referral from Dr. Einar Gip for this pt to participate in Cardiac rehab with the diagnosis of 12/27/19 NSTEMI with medical therapy.  Clinical review of pt follow up appt on 01/10/20 with [facility] - cardiologist office note. Pt is making the expected progress in recovery.  Pt appropriate for scheduling for on site cardiac rehab and/or enrollment in Virtual Cardiac Rehab.  Pt Covid Risk Score is 4.  Will forward to staff for follow up with insurance verification and scheduling. Cherre Huger, BSN Cardiac and Training and development officer

## 2020-01-15 DIAGNOSIS — R5383 Other fatigue: Secondary | ICD-10-CM | POA: Diagnosis not present

## 2020-01-15 DIAGNOSIS — E039 Hypothyroidism, unspecified: Secondary | ICD-10-CM | POA: Diagnosis not present

## 2020-01-15 DIAGNOSIS — C679 Malignant neoplasm of bladder, unspecified: Secondary | ICD-10-CM | POA: Diagnosis not present

## 2020-01-15 DIAGNOSIS — C778 Secondary and unspecified malignant neoplasm of lymph nodes of multiple regions: Secondary | ICD-10-CM | POA: Diagnosis not present

## 2020-01-15 DIAGNOSIS — Z23 Encounter for immunization: Secondary | ICD-10-CM | POA: Diagnosis not present

## 2020-01-15 DIAGNOSIS — N136 Pyonephrosis: Secondary | ICD-10-CM | POA: Diagnosis not present

## 2020-01-15 DIAGNOSIS — Z86718 Personal history of other venous thrombosis and embolism: Secondary | ICD-10-CM | POA: Diagnosis not present

## 2020-01-15 DIAGNOSIS — D630 Anemia in neoplastic disease: Secondary | ICD-10-CM | POA: Diagnosis not present

## 2020-01-15 DIAGNOSIS — C7989 Secondary malignant neoplasm of other specified sites: Secondary | ICD-10-CM | POA: Diagnosis not present

## 2020-01-15 DIAGNOSIS — C678 Malignant neoplasm of overlapping sites of bladder: Secondary | ICD-10-CM | POA: Diagnosis not present

## 2020-01-16 DIAGNOSIS — I25118 Atherosclerotic heart disease of native coronary artery with other forms of angina pectoris: Secondary | ICD-10-CM

## 2020-01-16 DIAGNOSIS — E039 Hypothyroidism, unspecified: Secondary | ICD-10-CM | POA: Diagnosis not present

## 2020-01-16 DIAGNOSIS — C679 Malignant neoplasm of bladder, unspecified: Secondary | ICD-10-CM | POA: Diagnosis not present

## 2020-01-16 DIAGNOSIS — I890 Lymphedema, not elsewhere classified: Secondary | ICD-10-CM | POA: Diagnosis not present

## 2020-01-16 DIAGNOSIS — R079 Chest pain, unspecified: Secondary | ICD-10-CM | POA: Diagnosis not present

## 2020-01-16 MED ORDER — ASPIRIN 81 MG PO CHEW: 81.0000 mg | CHEWABLE_TABLET | Freq: Every day | ORAL | Status: AC

## 2020-01-16 NOTE — Telephone Encounter (Signed)
From patient.

## 2020-01-16 NOTE — Telephone Encounter (Signed)
Patient discussed with his oncologist at [facility] regarding discontinuation of Xarelto.  I will start him on aspirin 81 mg daily, he will continue with Plavix 75 mg daily.    ICD-10-CM   1. Coronary artery disease of native artery of native heart with stable angina pectoris ([place])  I25.118 aspirin (ASPIRIN CHILDRENS) 81 MG chewable tablet     Adrian Prows, MD, [facility] 01/16/2020, 12:40 PM Office: [phone] Pager: [phone]

## 2020-01-17 ENCOUNTER — Other Ambulatory Visit: Payer: Self-pay

## 2020-01-17 ENCOUNTER — Ambulatory Visit: Payer: BC Managed Care – PPO

## 2020-01-17 DIAGNOSIS — I25118 Atherosclerotic heart disease of native coronary artery with other forms of angina pectoris: Secondary | ICD-10-CM

## 2020-01-23 ENCOUNTER — Encounter (HOSPITAL_COMMUNITY): Payer: Self-pay

## 2020-01-23 ENCOUNTER — Telehealth (HOSPITAL_COMMUNITY): Payer: Self-pay

## 2020-01-23 NOTE — Telephone Encounter (Signed)
Pt insurance is active and benefits verified through [employer] Co-pay 0, DED $7,550/$7,550 met, out of pocket $8,550/$8,550 met, co-insurance 50%. no pre-authorization required. Passport, 01/23/2020@10 :23am, REF# [id number]  Will contact patient to see if he is interested in the Cardiac Rehab Program. If interested, patient will need to complete follow up appt. Once completed, patient will be contacted for scheduling upon review by the RN Navigator.

## 2020-01-28 NOTE — Telephone Encounter (Signed)
From pt

## 2020-02-06 NOTE — Telephone Encounter (Signed)
No response from pt.  Closed referral  

## 2020-02-12 ENCOUNTER — Telehealth: Payer: Self-pay

## 2020-02-12 DIAGNOSIS — K219 Gastro-esophageal reflux disease without esophagitis: Secondary | ICD-10-CM

## 2020-02-12 DIAGNOSIS — E039 Hypothyroidism, unspecified: Secondary | ICD-10-CM | POA: Diagnosis not present

## 2020-02-12 MED ORDER — PANTOPRAZOLE SODIUM 40 MG PO TBEC: 40.0000 mg | DELAYED_RELEASE_TABLET | Freq: Every day | ORAL | 2 refills | Status: DC

## 2020-02-12 NOTE — Addendum Note (Signed)
Addended by: Kela Millin on: 02/12/2020 06:58 PM   Modules accepted: Orders

## 2020-02-12 NOTE — Telephone Encounter (Signed)
ICD-10-CM   1. Gastroesophageal reflux disease without esophagitis  K21.9 pantoprazole (PROTONIX) 40 MG tablet    Meds ordered this encounter  Medications  . pantoprazole (PROTONIX) 40 MG tablet    Sig: Take 1 tablet (40 mg total) by mouth daily before breakfast.    Dispense:  30 tablet    Refill:  2    Medications Discontinued During This Encounter  Medication Reason  . nizatidine (AXID) 150 MG capsule National Drug Shortage    [employer]

## 2020-02-12 NOTE — Telephone Encounter (Signed)
Pharmacy request new Rx for Nizatidine. Its on back order. Can we send alternative? AD/S

## 2020-02-18 ENCOUNTER — Other Ambulatory Visit: Payer: Self-pay

## 2020-02-18 MED ORDER — METOPROLOL TARTRATE 25 MG PO TABS
25.0000 mg | ORAL_TABLET | Freq: Two times a day (BID) | ORAL | 2 refills | Status: DC
Start: 2020-02-18 — End: 2020-06-23

## 2020-02-20 ENCOUNTER — Encounter: Payer: Self-pay | Admitting: Cardiology

## 2020-02-20 ENCOUNTER — Other Ambulatory Visit: Payer: Self-pay

## 2020-02-20 ENCOUNTER — Ambulatory Visit: Payer: BC Managed Care – PPO | Admitting: Cardiology

## 2020-02-20 VITALS — BP 113/77 | HR 80 | Resp 16 | Ht 68.0 in | Wt 202.0 lb

## 2020-02-20 DIAGNOSIS — K219 Gastro-esophageal reflux disease without esophagitis: Secondary | ICD-10-CM

## 2020-02-20 DIAGNOSIS — I25118 Atherosclerotic heart disease of native coronary artery with other forms of angina pectoris: Secondary | ICD-10-CM | POA: Diagnosis not present

## 2020-02-20 DIAGNOSIS — E7800 Pure hypercholesterolemia, unspecified: Secondary | ICD-10-CM | POA: Diagnosis not present

## 2020-02-20 MED ORDER — FAMOTIDINE 20 MG PO TABS: 20.0000 mg | ORAL_TABLET | Freq: Two times a day (BID) | ORAL | 1 refills | Status: DC

## 2020-02-20 NOTE — Progress Notes (Signed)
Primary Physician/Referring:  [name], MD  Patient ID: Robert Peters., male    DOB: 1955-09-30, 64 y.o.   MRN: [id number]  Chief Complaint  Patient presents with   Chest Pain   Coronary Artery Disease   Follow-up    6 months   HPI:    Robert Peters.  is a 64 y.o. Caucasian male with history of bladder cancer, SP radical cystectomy and has a urostomy bag performed in 2019,  now being treated for bladder cancer at [facility].  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  He presented on 12/27/2018 with exertional chest tightness and rest pain and was found to have positive cardiac markers for unstable angina with mildly elevated HS Troponins.  Cardiac risk factors include hyperlipidemia.  He underwent coronary angiography and recommended medical therapy with mid LAD 60% ulcerated stenosis to be culprit and mild noncritical disease in other vessels, in view of brisk flow, he  was discharged home the same day with Plavix and Xarelto on board, as his DVT was very remote in the past, after obtaining informed consent from [facility] [place] discontinued and is presently on aspirin and Plavix.  He now presents for a 6-week office visit, his wife is concerned that he is on high-dose statins.  Since being on Plavix, patient has also noticed GERD, PPI was prescribed but not been read yet.  Otherwise he has not had any chest pain suggestive of unstable angina or angina pectoris and has not used any sublingual nitroglycerin.   Past Medical History:  Diagnosis Date   Cancer [facility])    Prostate and Bladder Ca   Past Surgical History:  Procedure Laterality Date   CARDIAC CATHETERIZATION  12/27/2019   Cystectomy with Ileal Conduit  12/2017   CYSTOSCOPY  11/2017   LEFT HEART CATH AND CORONARY ANGIOGRAPHY N/A 12/27/2019   Procedure: LEFT HEART CATH AND CORONARY  ANGIOGRAPHY and possible angioplasty;  Surgeon: Adrian Prows, MD;  Location: [place] [facility];  Service: Cardiovascular;  Laterality: N/A;   Nephrostomy tube with Drain  12/2018   Percutaneous Nephrosotomy Tube  2019   Radical Prostatectomy  02/2019   WISDOM TOOTH EXTRACTION     Family History  Problem Relation Age of Onset   Stroke Mother    Hypertension Mother    Valvular heart disease Father    Hypertension Brother     Social History   Tobacco Use   Smoking status: Former Smoker    Packs/day: 1.00    Types: Cigarettes    Quit date: 12/26/1973    Years since quitting: 46.1   Smokeless tobacco: Never Used  Substance Use Topics   Alcohol use: Never   Marital Status: Married  ROS  Review of Systems  Cardiovascular: Negative for chest pain, dyspnea on exertion and leg swelling.  Gastrointestinal: Positive for heartburn. Negative for melena.   Objective  Blood pressure 113/77, pulse 80, resp. rate 16, height 5\' 8"  (1.727 m), weight 202 lb (91.6 kg), SpO2 97 %.  Vitals with BMI 02/20/2020 01/10/2020 12/27/2019  Height 5\' 8"  5\' 8"  -  Weight 202 lbs 199 lbs -  BMI 02.58 52.77 -  Systolic 824 235 361  Diastolic 77 90 74  Pulse 80 73 67     Physical Exam Cardiovascular:     Rate and Rhythm: Normal rate and regular rhythm.     Pulses: Intact  distal pulses.     Heart sounds: Normal heart sounds. No murmur heard.  No gallop.      Comments: No leg edema, no JVD. Pulmonary:     Effort: Pulmonary effort is normal.     Breath sounds: Normal breath sounds.  Abdominal:     General: Bowel sounds are normal.     Palpations: Abdomen is soft.     Comments: Urostomy bag noted. Stoma healthy  Genitourinary:    Comments: Urostomy present   Laboratory examination:   Recent Labs    12/27/19 0647  NA 138  K 3.4*  CL 103  CO2 26  GLUCOSE 100*  BUN 25*  CREATININE 1.12  CALCIUM 8.8*  GFRNONAA >60   CrCl cannot be calculated (Patient's most recent lab result is  older than the maximum 21 days allowed.).  CMP Latest Ref Rng & Units 12/27/2019 10/19/2017  Glucose 70 - 99 mg/dL 100(H) 100(H)  BUN 8 - 23 mg/dL 25(H) 26(H)  Creatinine 0.61 - 1.24 mg/dL 1.12 0.95  Sodium 135 - 145 mmol/L 138 141  Potassium 3.5 - 5.1 mmol/L 3.4(L) 3.9  Chloride 98 - 111 mmol/L 103 103  CO2 22 - 32 mmol/L 26 26  Calcium 8.9 - 10.3 mg/dL 8.8(L) 8.9  Total Protein 6.5 - 8.1 g/dL - 7.7  Total Bilirubin 0.3 - 1.2 mg/dL - 0.7  Alkaline Phos 38 - 126 U/L - 54  AST 15 - 41 U/L - 26  ALT 0 - 44 U/L - 20   CBC Latest Ref Rng & Units 12/27/2019 10/19/2017  WBC 4.0 - 10.5 K/uL 7.7 7.3  Hemoglobin 13.0 - 17.0 g/dL 13.8 14.4  Hematocrit 39 - 52 % 43.8 43.8  Platelets 150 - 400 K/uL 299 184    Lipid Panel Recent Labs    12/27/19 0842  CHOL 151  TRIG 82  LDLCALC 90  VLDL 16  HDL 45  CHOLHDL 3.4   HEMOGLOBIN A1C No results found for: HGBA1C, MPG TSH No results for input(s): TSH in the last 8760 hours.   External labs:   Labs 01/15/2020:  Sodium 140, potassium 4.4, BUN 32, creatinine 1.1.  TSH normal, T4 normal.  Hb 12.6/HCT 40.7, platelets 219, mild microcytic index.  Blood type O+.  Medications and allergies   Allergies  Allergen Reactions   Avelumab Other (See Comments)    Hypertension, shakes, chills, and creatine went up.     Outpatient Medications Prior to Visit  Medication Sig Dispense Refill   aspirin (ASPIRIN CHILDRENS) 81 MG chewable tablet Chew 1 tablet (81 mg total) by mouth daily.     atorvastatin (LIPITOR) 80 MG tablet Take 1 tablet (80 mg total) by mouth daily. 30 tablet 2   clopidogrel (PLAVIX) 75 MG tablet Take 1 tablet (75 mg total) by mouth daily. 30 tablet 2   isosorbide mononitrate (IMDUR) 30 MG 24 hr tablet Take 1 tablet (30 mg total) by mouth daily. 30 tablet 2   levothyroxine (SYNTHROID) 112 MCG tablet Take 112 mcg by mouth in the morning.      metoprolol tartrate (LOPRESSOR) 25 MG tablet Take 1 tablet (25 mg total) by mouth  2 (two) times daily. 60 tablet 2   nitroGLYCERIN (NITROSTAT) 0.4 MG SL tablet Place 1 tablet (0.4 mg total) under the tongue every 5 (five) minutes x 3 doses as needed for chest pain. 25 tablet 1   acetaminophen (TYLENOL) 500 MG tablet Take 500-1,000 mg by mouth every 6 (six) hours as needed (  back pain).     pantoprazole (PROTONIX) 40 MG tablet Take 1 tablet (40 mg total) by mouth daily before breakfast. (Patient not taking: Reported on 02/20/2020) 30 tablet 2   No facility-administered medications prior to visit.    Radiology:   DG Chest 2 View Result Date: 12/27/2019 CLINICAL DATA:  Chest pain. EXAM: CHEST - 2 VIEW COMPARISON:  None. FINDINGS: The heart size and mediastinal contours are within normal limits. Both lungs are clear. No pneumothorax or pleural effusion is noted. The visualized skeletal structures are unremarkable. IMPRESSION: No active cardiopulmonary disease. Electronically Signed   By: Marijo Conception M.D.   On: 12/27/2019 08:10   Cardiac Studies:   Left Heart Catheterization 12/27/19: LV: Normal LV systolic function. No regional wall motion abnormality. Normal LVEDP. No pressure gradient across the aortic valve. Left main: Normal. LAD: There is a ulcerated plaque in the mid LAD. Nonflow limiting. There is no contrast hung up. Post nitroglycerin does not reveal any significant stenosis. Circumflex: Moderate sized vessel, mild proximal disease of 20%. Ramus intermediate: Mild disease in the proximal segment of 10 to 20%, mid segment has a 30% stenosis. RCA: Dominant. There is a separate ostia for conus branch and AV nodal branch. There is mild disease in the proximal RCA of 20%. PDA ostium has about a 20 to 30% stenosis.  Impression: The culprit lesion appears to be mid LAD. However there is very brisk flow and the ulceration does not appear to be any complex. There is TIMI-3 flow noted. Can be treated medically, can be discharged home with Xarelto (Prior DVT)  starting tonight and add Plavix 75 mg daily.   Echocardiogram 01/17/2020: Normal LV systolic function with visual EF 50-55%. Left ventricle cavity is normal in size. Normal global wall motion. Normal diastolic filling pattern, normal LAP.  Mild (Grade I) mitral regurgitation. Mild tricuspid regurgitation. No evidence of pulmonary hypertension. IVC is dilated with blunted respiratory response. Compared to prior study dated 07/22/2019: no significant change.    EKG:    EKG 02/20/2020: Normal sinus rhythm at rate of 72 bpm, normal axis, no evidence of ischemia, normal EKG. compared to 01/10/2020, nonspecific ST abnormalities not present.  EKG 12/27/2019: Normal sinus rhythm with rate of 87 bpm, normal axis. Nonspecific ST abnormalities laterally with nonspecific T abnormalities in the anterolateral leads. Compared to 10/19/2017, ST-T wave changes new.  Assessment     ICD-10-CM   1. Coronary artery disease of native artery of native heart with stable angina pectoris ([place])  I25.118 EKG 12-Lead  2. Gastroesophageal reflux disease without esophagitis  K21.9 famotidine (PEPCID) 20 MG tablet  3. Hypercholesteremia  E78.00      Medications Discontinued During This Encounter  Medication Reason   acetaminophen (TYLENOL) 500 MG tablet No longer needed (for PRN medications)   pantoprazole (PROTONIX) 40 MG tablet National Drug Shortage    Meds ordered this encounter  Medications   famotidine (PEPCID) 20 MG tablet    Sig: Take 1 tablet (20 mg total) by mouth 2 (two) times daily.    Dispense:  60 tablet    Refill:  1    Recommendations:   Robert Peters. is a 64 y.o. Caucasian male with history of bladder cancer, SP radical cystectomy and has a urostomy bag performed in 2019,  now being treated for bladder cancer at [facility].  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  He presented on 12/27/2018 with exertional chest tightness and rest pain and was found to have positive cardiac markers for unstable angina with mildly elevated HS Troponins.  Cardiac risk factors include hyperlipidemia.  He underwent coronary angiography and recommended medical therapy with mid LAD 60% ulcerated stenosis to be culprit and mild noncritical disease in other vessels, in view of brisk flow recommended medical therapy.  He has not had recurrence of anginal symptoms since then. He is having some symptoms of heartburn, suspect probably related to Plavix, PPI was ordered but due to shortage, has now been failed, I will place him on Pepcid 20 mg p.o. twice daily.  I reviewed his echocardiogram, there is no pericardial effusion to explain his chest pain, LV function remains normal and stable, continue present medical therapy.  With regard to high-dose high intensity statin therapy, advised the patient that he clearly presented with ACS and it would be appropriate for him to be on high intensity statins in spite of his lipids being mildly abnormal.  I have left it to them to decide whether they should be reducing the dose as she appears to be very apprehensive about statins.  We could certainly reduce the dose after 6 months of therapy, I also plan on discontinuing Plavix at that time.   Adrian Prows, MD, [facility] 02/20/2020, 3:37 PM Office: [phone]

## 2020-02-26 ENCOUNTER — Other Ambulatory Visit: Payer: Self-pay

## 2020-02-26 MED ORDER — ATORVASTATIN CALCIUM 80 MG PO TABS
80.0000 mg | ORAL_TABLET | Freq: Every day | ORAL | 3 refills | Status: DC
Start: 2020-02-26 — End: 2020-08-13

## 2020-03-04 DIAGNOSIS — Z936 Other artificial openings of urinary tract status: Secondary | ICD-10-CM | POA: Diagnosis not present

## 2020-03-16 ENCOUNTER — Other Ambulatory Visit: Payer: Self-pay | Admitting: Cardiology

## 2020-03-16 DIAGNOSIS — K219 Gastro-esophageal reflux disease without esophagitis: Secondary | ICD-10-CM

## 2020-04-05 ENCOUNTER — Other Ambulatory Visit: Payer: Self-pay | Admitting: Cardiology

## 2020-04-05 DIAGNOSIS — I25118 Atherosclerotic heart disease of native coronary artery with other forms of angina pectoris: Secondary | ICD-10-CM

## 2020-04-22 DIAGNOSIS — R5383 Other fatigue: Secondary | ICD-10-CM | POA: Diagnosis not present

## 2020-04-22 DIAGNOSIS — C678 Malignant neoplasm of overlapping sites of bladder: Secondary | ICD-10-CM | POA: Diagnosis not present

## 2020-04-22 DIAGNOSIS — C778 Secondary and unspecified malignant neoplasm of lymph nodes of multiple regions: Secondary | ICD-10-CM | POA: Diagnosis not present

## 2020-04-22 DIAGNOSIS — D630 Anemia in neoplastic disease: Secondary | ICD-10-CM | POA: Diagnosis not present

## 2020-04-22 DIAGNOSIS — E039 Hypothyroidism, unspecified: Secondary | ICD-10-CM | POA: Diagnosis not present

## 2020-04-22 DIAGNOSIS — Z86718 Personal history of other venous thrombosis and embolism: Secondary | ICD-10-CM | POA: Diagnosis not present

## 2020-04-22 DIAGNOSIS — Z87891 Personal history of nicotine dependence: Secondary | ICD-10-CM | POA: Diagnosis not present

## 2020-04-22 DIAGNOSIS — Z9079 Acquired absence of other genital organ(s): Secondary | ICD-10-CM | POA: Diagnosis not present

## 2020-04-22 DIAGNOSIS — C679 Malignant neoplasm of bladder, unspecified: Secondary | ICD-10-CM | POA: Diagnosis not present

## 2020-04-22 DIAGNOSIS — Z23 Encounter for immunization: Secondary | ICD-10-CM | POA: Diagnosis not present

## 2020-04-22 DIAGNOSIS — C7989 Secondary malignant neoplasm of other specified sites: Secondary | ICD-10-CM | POA: Diagnosis not present

## 2020-04-23 ENCOUNTER — Other Ambulatory Visit: Payer: Self-pay

## 2020-04-23 MED ORDER — CLOPIDOGREL BISULFATE 75 MG PO TABS: 75.0000 mg | ORAL_TABLET | Freq: Every day | ORAL | 2 refills | Status: DC

## 2020-04-27 ENCOUNTER — Encounter: Payer: Self-pay | Admitting: Cardiology

## 2020-04-27 ENCOUNTER — Ambulatory Visit (INDEPENDENT_AMBULATORY_CARE_PROVIDER_SITE_OTHER): Payer: Medicare Other | Admitting: Cardiology

## 2020-04-27 ENCOUNTER — Other Ambulatory Visit: Payer: Self-pay

## 2020-04-27 VITALS — BP 128/85 | HR 78 | Ht 68.0 in | Wt 201.8 lb

## 2020-04-27 DIAGNOSIS — E7800 Pure hypercholesterolemia, unspecified: Secondary | ICD-10-CM

## 2020-04-27 DIAGNOSIS — I25118 Atherosclerotic heart disease of native coronary artery with other forms of angina pectoris: Secondary | ICD-10-CM

## 2020-04-27 NOTE — Patient Instructions (Signed)
Medication Instructions:   STOP CLOPIDOGREL  STOP ISOSORBIDE  HOLD ATORVASTATIN AND AFTER 2 WEEKS LET us KNOW HOW YOUR SYMPTOMS ARE  *If you need a refill on your cardiac medications before your next appointment, please call your pharmacy*  Follow-Up: At [facility], you and your health needs are our priority.  As part of our continuing mission to provide you with exceptional heart care, we have created designated Provider Care Teams.  These Care Teams include your primary Cardiologist (physician) and Advanced Practice Providers (APPs -  Physician Assistants and Nurse Practitioners) who all work together to provide you with the care you need, when you need it.  We recommend signing up for the patient portal called "MyChart".  Sign up information is provided on this After Visit Summary.  MyChart is used to connect with patients for Virtual Visits (Telemedicine).  Patients are able to view lab/test results, encounter notes, upcoming appointments, etc.  Non-urgent messages can be sent to your provider as well.   To learn more about what you can do with MyChart, go to [email or web address].    Your next appointment:   3 month(s)  The format for your next appointment:   In Person  Provider:   Kirk Ruths, MD

## 2020-04-27 NOTE — Progress Notes (Signed)
Referring-Robert Koirala MD Reason for referral-CAD  HPI: 24 male for evaluation of CAD at request of Robert Koirala MD. Previously followed by Dr. Einar Gip.  Patient has a history of bladder cancer status post radical cystectomy now being treated for bladder cancer at [facility].  Also treated for epidural abscess with prolonged antibiotic course.  Patient had arteriogram August 2019 that showed no acute arterial abnormality and only minimal atherosclerosis.  Had cardiac catheterization October 2021.  LV function was normal as well as left ventricular end-diastolic pressure.  There was 20% circumflex, 10 to 20% ramus intermedius with 30% mid, 20% proximal right coronary artery and 20 to 30% PDA.  There was note of 60 % ulcerated plaque in the mid LAD which was not flow-limiting.  Echocardiogram October 2021 showed normal LV function, mild mitral and tricuspid regurgitation.  Since his cardiac catheterization in the fall he has had rare chest pain that is typically not exertional.  It resolves with drinking water immediately.  He does not have exertional chest pain.  No dyspnea on exertion, orthopnea, PND or syncope.  He has bilateral lower extremity lymphedema.  Current Outpatient Medications  Medication Sig Dispense Refill  . aspirin (ASPIRIN CHILDRENS) 81 MG chewable tablet Chew 1 tablet (81 mg total) by mouth daily.    Marland Kitchen atorvastatin (LIPITOR) 80 MG tablet Take 1 tablet (80 mg total) by mouth daily. 90 tablet 3  . levothyroxine (SYNTHROID) 112 MCG tablet Take 112 mcg by mouth in the morning.     . metoprolol tartrate (LOPRESSOR) 25 MG tablet Take 1 tablet (25 mg total) by mouth 2 (two) times daily. 60 tablet 2  . nitroGLYCERIN (NITROSTAT) 0.4 MG SL tablet Place 1 tablet (0.4 mg total) under the tongue every 5 (five) minutes x 3 doses as needed for chest pain. 25 tablet 1   No current facility-administered medications for this visit.    Allergies  Allergen Reactions  . Avelumab Other (See  Comments)    Hypertension, shakes, chills, and creatine went up.     Past Medical History:  Diagnosis Date  . Bladder cancer ([place])   . CAD (coronary artery disease)   . Cancer [facility])    Prostate and Bladder Ca  . DVT (deep vein thrombosis) in pregnancy   . Hyperlipidemia     Past Surgical History:  Procedure Laterality Date  . CARDIAC CATHETERIZATION  12/27/2019  . Cystectomy with Ileal Conduit  12/2017  . CYSTOSCOPY  11/2017  . LEFT HEART CATH AND CORONARY ANGIOGRAPHY N/A 12/27/2019   Procedure: LEFT HEART CATH AND CORONARY ANGIOGRAPHY and possible angioplasty;  Surgeon: Adrian Prows, MD;  Location: [place] [facility];  Service: Cardiovascular;  Laterality: N/A;  . Nephrostomy tube with Drain  12/2018  . Percutaneous Nephrosotomy Tube  2019  . Radical Prostatectomy  02/2019  . WISDOM TOOTH EXTRACTION      Social History   Socioeconomic History  . Marital status: Married    Spouse name: Not on file  . Number of children: 2  . Years of education: Not on file  . Highest education level: Not on file  Occupational History  . Not on file  Tobacco Use  . Smoking status: Former Smoker    Packs/day: 1.00    Types: Cigarettes    Quit date: 12/26/1973    Years since quitting: 46.3  . Smokeless tobacco: Never Used  Vaping Use  . Vaping Use: Never used  Substance and Sexual Activity  . Alcohol use:  Never  . Drug use: Never  . Sexual activity: Not Currently  Other Topics Concern  . Not on file  Social History Narrative  . Not on file   Social Determinants of Health   Financial Resource Strain: Not on file  Food Insecurity: Not on file  Transportation Needs: Not on file  Physical Activity: Not on file  Stress: Not on file  Social Connections: Not on file  Intimate Partner Violence: Not on file    Family History  Problem Relation Age of Onset  . Stroke Mother   . Hypertension Mother   . Valvular heart disease Father   . Hypertension Brother     ROS: Patient  notes some discomfort in his ankles and difficulty flexing his right foot but no fevers or chills, productive cough, hemoptysis, dysphasia, odynophagia, melena, hematochezia, dysuria, hematuria, rash, seizure activity, orthopnea, PND, claudication. Remaining systems are negative.  Physical Exam:   Blood pressure 128/85, pulse 78, height 5\' 8"  (1.727 m), weight 201 lb 12.8 oz (91.5 kg), SpO2 94 %.  General:  Well developed/well nourished in NAD Skin warm/dry Patient not depressed No peripheral clubbing Back-normal HEENT-normal/normal eyelids Neck supple/normal carotid upstroke bilaterally; no bruits; no JVD; no thyromegaly chest - CTA/ normal expansion CV - RRR/normal S1 and S2; no murmurs, rubs or gallops;  PMI nondisplaced Abdomen -NT/ND, no HSM, no mass, + bowel sounds, no bruit 2+ femoral pulses, no bruits Ext-no edema, chords, 2+ DP Neuro-grossly nonfocal  ECG -normal sinus rhythm at a rate of 78, no ST changes.  Personally reviewed  A/P  1 coronary artery disease-patient denies exertional chest pain.  We will continue aspirin but discontinue Plavix.  He is concerned that Lipitor may be contributing to decreased mobility in his ankles.  I explained I think this is unlikely but we will hold Lipitor for 4 to 6 weeks.  If his symptoms do not improve we will resume.  If they do improve we will try different statin or consider Zetia or Repatha.  I will continue beta-blockade.  Discontinue isosorbide.  We will review his previous cardiac catheterization films.  2 hyperlipidemia-as outlined above we are holding Lipitor for 4 to 6 weeks.  If ankle pain improves we will try different statin or consider Repatha or Zetia.  If his symptoms are unchanged we will resume Lipitor given documented coronary disease.  3 history of DVT-this occurred after driving from [place].  This is also immediately prior to his diagnosis of bladder cancer.  His Xarelto was discontinued back in October and he has not  had recurrences.  We will continue off.  Continue aspirin but discontinue Plavix.  Robert Ruths, MD

## 2020-05-15 ENCOUNTER — Ambulatory Visit: Payer: BC Managed Care – PPO | Admitting: Cardiology

## 2020-05-25 ENCOUNTER — Encounter: Payer: Self-pay | Admitting: *Deleted

## 2020-06-17 DIAGNOSIS — M4317 Spondylolisthesis, lumbosacral region: Secondary | ICD-10-CM | POA: Diagnosis not present

## 2020-06-17 DIAGNOSIS — R29898 Other symptoms and signs involving the musculoskeletal system: Secondary | ICD-10-CM | POA: Diagnosis not present

## 2020-06-17 DIAGNOSIS — M47816 Spondylosis without myelopathy or radiculopathy, lumbar region: Secondary | ICD-10-CM | POA: Diagnosis not present

## 2020-06-17 DIAGNOSIS — M48061 Spinal stenosis, lumbar region without neurogenic claudication: Secondary | ICD-10-CM | POA: Diagnosis not present

## 2020-06-17 DIAGNOSIS — C678 Malignant neoplasm of overlapping sites of bladder: Secondary | ICD-10-CM | POA: Diagnosis not present

## 2020-06-22 DIAGNOSIS — C778 Secondary and unspecified malignant neoplasm of lymph nodes of multiple regions: Secondary | ICD-10-CM | POA: Diagnosis not present

## 2020-06-22 DIAGNOSIS — M21371 Foot drop, right foot: Secondary | ICD-10-CM | POA: Diagnosis not present

## 2020-06-23 ENCOUNTER — Other Ambulatory Visit: Payer: Self-pay | Admitting: Cardiology

## 2020-07-22 DIAGNOSIS — M21371 Foot drop, right foot: Secondary | ICD-10-CM | POA: Diagnosis not present

## 2020-07-22 DIAGNOSIS — Z79899 Other long term (current) drug therapy: Secondary | ICD-10-CM | POA: Diagnosis not present

## 2020-07-22 DIAGNOSIS — Z9079 Acquired absence of other genital organ(s): Secondary | ICD-10-CM | POA: Diagnosis not present

## 2020-07-22 DIAGNOSIS — Z9221 Personal history of antineoplastic chemotherapy: Secondary | ICD-10-CM | POA: Diagnosis not present

## 2020-07-22 DIAGNOSIS — C678 Malignant neoplasm of overlapping sites of bladder: Secondary | ICD-10-CM | POA: Diagnosis not present

## 2020-07-22 DIAGNOSIS — Z8052 Family history of malignant neoplasm of bladder: Secondary | ICD-10-CM | POA: Diagnosis not present

## 2020-07-22 DIAGNOSIS — Z87891 Personal history of nicotine dependence: Secondary | ICD-10-CM | POA: Diagnosis not present

## 2020-07-22 DIAGNOSIS — C778 Secondary and unspecified malignant neoplasm of lymph nodes of multiple regions: Secondary | ICD-10-CM | POA: Diagnosis not present

## 2020-07-22 DIAGNOSIS — R531 Weakness: Secondary | ICD-10-CM | POA: Diagnosis not present

## 2020-07-28 NOTE — Progress Notes (Deleted)
HPI: FU CAD. Previously followed by Dr. Einar Gip. Patient has a history of bladder cancer status post radical cystectomy now being treated for bladder cancer at [facility]. Also treated for epidural abscess with prolonged antibiotic course.  Patient had arteriogram August 2019 that showed no acute arterial abnormality and only minimal atherosclerosis. Had cardiac catheterization October 2021. LV function was normal as well as left ventricular end-diastolic pressure. There was 20% circumflex, 10 to 20% ramus intermedius with 30% mid, 20% proximal right coronary artery and 20 to 30% PDA.  There was note of 60 % ulcerated plaque in the mid LAD which was not flow-limiting. Echo October 2021 showed normal LV function, mild mitral and tricuspid regurgitation. Since his cardiac catheterization  Current Outpatient Medications  Medication Sig Dispense Refill  . aspirin (ASPIRIN CHILDRENS) 81 MG chewable tablet Chew 1 tablet (81 mg total) by mouth daily.    Marland Kitchen atorvastatin (LIPITOR) 80 MG tablet Take 1 tablet (80 mg total) by mouth daily. 90 tablet 3  . levothyroxine (SYNTHROID) 112 MCG tablet Take 112 mcg by mouth in the morning.     . metoprolol tartrate (LOPRESSOR) 25 MG tablet TAKE 1 TABLET BY MOUTH TWICE A DAY 180 tablet 0  . nitroGLYCERIN (NITROSTAT) 0.4 MG SL tablet Place 1 tablet (0.4 mg total) under the tongue every 5 (five) minutes x 3 doses as needed for chest pain. 25 tablet 1   No current facility-administered medications for this visit.     Past Medical History:  Diagnosis Date  . Bladder cancer ([place])   . CAD (coronary artery disease)   . Cancer [facility])    Prostate and Bladder Ca  . DVT (deep vein thrombosis) in pregnancy   . Hyperlipidemia     Past Surgical History:  Procedure Laterality Date  . CARDIAC CATHETERIZATION  12/27/2019  . Cystectomy with Ileal Conduit  12/2017  . CYSTOSCOPY  11/2017  . LEFT HEART CATH AND CORONARY ANGIOGRAPHY N/A 12/27/2019   Procedure: LEFT  HEART CATH AND CORONARY ANGIOGRAPHY and possible angioplasty;  Surgeon: Adrian Prows, MD;  Location: [place] [facility];  Service: Cardiovascular;  Laterality: N/A;  . Nephrostomy tube with Drain  12/2018  . Percutaneous Nephrosotomy Tube  2019  . Radical Prostatectomy  02/2019  . WISDOM TOOTH EXTRACTION      Social History   Socioeconomic History  . Marital status: Married    Spouse name: Not on file  . Number of children: 2  . Years of education: Not on file  . Highest education level: Not on file  Occupational History  . Not on file  Tobacco Use  . Smoking status: Former Smoker    Packs/day: 1.00    Types: Cigarettes    Quit date: 12/26/1973    Years since quitting: 46.6  . Smokeless tobacco: Never Used  Vaping Use  . Vaping Use: Never used  Substance and Sexual Activity  . Alcohol use: Never  . Drug use: Never  . Sexual activity: Not Currently  Other Topics Concern  . Not on file  Social History Narrative  . Not on file   Social Determinants of Health   Financial Resource Strain: Not on file  Food Insecurity: Not on file  Transportation Needs: Not on file  Physical Activity: Not on file  Stress: Not on file  Social Connections: Not on file  Intimate Partner Violence: Not on file    Family History  Problem Relation Age of Onset  . Stroke  Mother   . Hypertension Mother   . Valvular heart disease Father   . Hypertension Brother     ROS: no fevers or chills, productive cough, hemoptysis, dysphasia, odynophagia, melena, hematochezia, dysuria, hematuria, rash, seizure activity, orthopnea, PND, pedal edema, claudication. Remaining systems are negative.  Physical Exam: Well-developed well-nourished in no acute distress.  Skin is warm and dry.  HEENT is normal.  Neck is supple.  Chest is clear to auscultation with normal expansion.  Cardiovascular exam is regular rate and rhythm.  Abdominal exam nontender or distended. No masses palpated. Extremities show no  edema. neuro grossly intact  ECG- personally reviewed  A/P  1 coronary artery disease-no chest pain.  We will continue aspirin and statin.  I previously reviewed his catheterization films with Dr. Martinique and medical therapy is felt to be appropriate.  2 hyperlipidemia-continue statin.  3 history of DVT-no recurrences.  Kirk Ruths, MD

## 2020-07-29 DIAGNOSIS — R29898 Other symptoms and signs involving the musculoskeletal system: Secondary | ICD-10-CM | POA: Diagnosis not present

## 2020-07-29 DIAGNOSIS — C679 Malignant neoplasm of bladder, unspecified: Secondary | ICD-10-CM | POA: Diagnosis not present

## 2020-07-31 ENCOUNTER — Other Ambulatory Visit: Payer: Self-pay | Admitting: Family Medicine

## 2020-07-31 DIAGNOSIS — C679 Malignant neoplasm of bladder, unspecified: Secondary | ICD-10-CM

## 2020-07-31 DIAGNOSIS — R29898 Other symptoms and signs involving the musculoskeletal system: Secondary | ICD-10-CM

## 2020-08-04 ENCOUNTER — Telehealth: Payer: Self-pay | Admitting: Cardiology

## 2020-08-04 ENCOUNTER — Other Ambulatory Visit: Payer: Self-pay

## 2020-08-04 ENCOUNTER — Ambulatory Visit
Admission: RE | Admit: 2020-08-04 | Discharge: 2020-08-04 | Disposition: A | Discharge status: Home / Self Care | Payer: Medicare Other | Source: Ambulatory Visit | Attending: Family Medicine | Admitting: Family Medicine

## 2020-08-04 DIAGNOSIS — C7931 Secondary malignant neoplasm of brain: Secondary | ICD-10-CM | POA: Diagnosis not present

## 2020-08-04 DIAGNOSIS — R29898 Other symptoms and signs involving the musculoskeletal system: Secondary | ICD-10-CM

## 2020-08-04 DIAGNOSIS — C679 Malignant neoplasm of bladder, unspecified: Secondary | ICD-10-CM

## 2020-08-04 NOTE — Telephone Encounter (Signed)
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 He cancelled his appt with Dr. Stanford Breed on 05/20 because he needs time to focus on the treatment and plan for this new health concern. He wanted to let Dr. Stanford Breed know just in case he has any concerns.

## 2020-08-05 DIAGNOSIS — Z936 Other artificial openings of urinary tract status: Secondary | ICD-10-CM | POA: Diagnosis not present

## 2020-08-06 ENCOUNTER — Telehealth: Payer: Self-pay | Admitting: Oncology

## 2020-08-06 NOTE — Telephone Encounter (Signed)
Received a new pt referral from Dr. Orland Mustard for metastatic urothelial cancer. Robert Peters has been scheduled to see Dr. Alen Blew on 5/26 at 2pm. Appt date and time given to the pt's wife.

## 2020-08-07 ENCOUNTER — Ambulatory Visit: Payer: Self-pay | Admitting: Cardiology

## 2020-08-07 DIAGNOSIS — C801 Malignant (primary) neoplasm, unspecified: Secondary | ICD-10-CM | POA: Diagnosis not present

## 2020-08-07 DIAGNOSIS — G936 Cerebral edema: Secondary | ICD-10-CM | POA: Diagnosis not present

## 2020-08-07 DIAGNOSIS — C678 Malignant neoplasm of overlapping sites of bladder: Secondary | ICD-10-CM | POA: Diagnosis not present

## 2020-08-07 DIAGNOSIS — C7931 Secondary malignant neoplasm of brain: Secondary | ICD-10-CM | POA: Diagnosis not present

## 2020-08-11 DIAGNOSIS — C778 Secondary and unspecified malignant neoplasm of lymph nodes of multiple regions: Secondary | ICD-10-CM | POA: Diagnosis not present

## 2020-08-11 DIAGNOSIS — C7931 Secondary malignant neoplasm of brain: Secondary | ICD-10-CM | POA: Diagnosis not present

## 2020-08-11 DIAGNOSIS — C678 Malignant neoplasm of overlapping sites of bladder: Secondary | ICD-10-CM | POA: Diagnosis not present

## 2020-08-11 DIAGNOSIS — R2232 Localized swelling, mass and lump, left upper limb: Secondary | ICD-10-CM | POA: Diagnosis not present

## 2020-08-11 DIAGNOSIS — C773 Secondary and unspecified malignant neoplasm of axilla and upper limb lymph nodes: Secondary | ICD-10-CM | POA: Diagnosis not present

## 2020-08-12 ENCOUNTER — Emergency Department (HOSPITAL_COMMUNITY): Payer: Medicare Other

## 2020-08-12 ENCOUNTER — Observation Stay (HOSPITAL_COMMUNITY)
Admission: EM | Admit: 2020-08-12 | Discharge: 2020-08-13 | Disposition: A | Discharge status: Home / Self Care | Payer: Medicare Other | Attending: Internal Medicine | Admitting: Internal Medicine

## 2020-08-12 DIAGNOSIS — R9431 Abnormal electrocardiogram [ECG] [EKG]: Secondary | ICD-10-CM | POA: Diagnosis not present

## 2020-08-12 DIAGNOSIS — I2510 Atherosclerotic heart disease of native coronary artery without angina pectoris: Secondary | ICD-10-CM | POA: Diagnosis not present

## 2020-08-12 DIAGNOSIS — E871 Hypo-osmolality and hyponatremia: Secondary | ICD-10-CM | POA: Diagnosis present

## 2020-08-12 DIAGNOSIS — C679 Malignant neoplasm of bladder, unspecified: Secondary | ICD-10-CM | POA: Diagnosis present

## 2020-08-12 DIAGNOSIS — Z20822 Contact with and (suspected) exposure to covid-19: Secondary | ICD-10-CM | POA: Insufficient documentation

## 2020-08-12 DIAGNOSIS — E785 Hyperlipidemia, unspecified: Secondary | ICD-10-CM | POA: Diagnosis present

## 2020-08-12 DIAGNOSIS — Z7982 Long term (current) use of aspirin: Secondary | ICD-10-CM | POA: Insufficient documentation

## 2020-08-12 DIAGNOSIS — G40909 Epilepsy, unspecified, not intractable, without status epilepticus: Secondary | ICD-10-CM

## 2020-08-12 DIAGNOSIS — Z043 Encounter for examination and observation following other accident: Secondary | ICD-10-CM | POA: Diagnosis not present

## 2020-08-12 DIAGNOSIS — Z79899 Other long term (current) drug therapy: Secondary | ICD-10-CM | POA: Diagnosis not present

## 2020-08-12 DIAGNOSIS — R739 Hyperglycemia, unspecified: Secondary | ICD-10-CM | POA: Diagnosis present

## 2020-08-12 DIAGNOSIS — D72829 Elevated white blood cell count, unspecified: Secondary | ICD-10-CM | POA: Diagnosis present

## 2020-08-12 DIAGNOSIS — C7931 Secondary malignant neoplasm of brain: Secondary | ICD-10-CM | POA: Insufficient documentation

## 2020-08-12 DIAGNOSIS — Z87891 Personal history of nicotine dependence: Secondary | ICD-10-CM | POA: Diagnosis not present

## 2020-08-12 DIAGNOSIS — Z8551 Personal history of malignant neoplasm of bladder: Secondary | ICD-10-CM | POA: Insufficient documentation

## 2020-08-12 DIAGNOSIS — I10 Essential (primary) hypertension: Secondary | ICD-10-CM | POA: Diagnosis not present

## 2020-08-12 DIAGNOSIS — R4182 Altered mental status, unspecified: Secondary | ICD-10-CM | POA: Diagnosis not present

## 2020-08-12 DIAGNOSIS — R404 Transient alteration of awareness: Secondary | ICD-10-CM | POA: Diagnosis not present

## 2020-08-12 DIAGNOSIS — E162 Hypoglycemia, unspecified: Secondary | ICD-10-CM | POA: Diagnosis present

## 2020-08-12 DIAGNOSIS — R569 Unspecified convulsions: Principal | ICD-10-CM | POA: Insufficient documentation

## 2020-08-12 DIAGNOSIS — Z8546 Personal history of malignant neoplasm of prostate: Secondary | ICD-10-CM | POA: Insufficient documentation

## 2020-08-12 DIAGNOSIS — C799 Secondary malignant neoplasm of unspecified site: Secondary | ICD-10-CM | POA: Diagnosis present

## 2020-08-12 DIAGNOSIS — W19XXXA Unspecified fall, initial encounter: Secondary | ICD-10-CM | POA: Diagnosis not present

## 2020-08-12 DIAGNOSIS — E869 Volume depletion, unspecified: Secondary | ICD-10-CM | POA: Diagnosis present

## 2020-08-12 DIAGNOSIS — J9811 Atelectasis: Secondary | ICD-10-CM | POA: Diagnosis not present

## 2020-08-12 LAB — CBC WITH DIFFERENTIAL/PLATELET
Abs Immature Granulocytes: 0.31 10*3/uL — ABNORMAL HIGH (ref 0.00–0.07)
Basophils Absolute: 0 10*3/uL (ref 0.0–0.1)
Basophils Relative: 0 %
Eosinophils Absolute: 0 10*3/uL (ref 0.0–0.5)
Eosinophils Relative: 0 %
HCT: 45.3 % (ref 39.0–52.0)
Hemoglobin: 14.8 g/dL (ref 13.0–17.0)
Immature Granulocytes: 3 %
Lymphocytes Relative: 9 %
Lymphs Abs: 1 10*3/uL (ref 0.7–4.0)
MCH: 29.7 pg (ref 26.0–34.0)
MCHC: 32.7 g/dL (ref 30.0–36.0)
MCV: 90.8 fL (ref 80.0–100.0)
Monocytes Absolute: 0.6 10*3/uL (ref 0.1–1.0)
Monocytes Relative: 5 %
Neutro Abs: 10.1 10*3/uL — ABNORMAL HIGH (ref 1.7–7.7)
Neutrophils Relative %: 83 %
Platelets: 292 10*3/uL (ref 150–400)
RBC: 4.99 MIL/uL (ref 4.22–5.81)
RDW: 13 % (ref 11.5–15.5)
WBC: 12 10*3/uL — ABNORMAL HIGH (ref 4.0–10.5)
nRBC: 0 % (ref 0.0–0.2)

## 2020-08-12 LAB — MAGNESIUM: Magnesium: 2.1 mg/dL (ref 1.7–2.4)

## 2020-08-12 LAB — BASIC METABOLIC PANEL
Anion gap: 13 (ref 5–15)
BUN: 33 mg/dL — ABNORMAL HIGH (ref 8–23)
CO2: 21 mmol/L — ABNORMAL LOW (ref 22–32)
Calcium: 8.7 mg/dL — ABNORMAL LOW (ref 8.9–10.3)
Chloride: 99 mmol/L (ref 98–111)
Creatinine, Ser: 1 mg/dL (ref 0.61–1.24)
GFR, Estimated: >60 mL/min (ref >60)
Glucose, Bld: 138 mg/dL — ABNORMAL HIGH (ref 70–99)
Potassium: 3.9 mmol/L (ref 3.5–5.1)
Sodium: 133 mmol/L — ABNORMAL LOW (ref 135–145)

## 2020-08-12 MED ORDER — ACETAMINOPHEN 325 MG PO TABS: 650.0000 mg | ORAL_TABLET | Freq: Four times a day (QID) | ORAL | Status: DC | PRN

## 2020-08-12 MED ORDER — LEVOTHYROXINE SODIUM 112 MCG PO TABS: 112.0000 ug | ORAL_TABLET | Freq: Every morning | ORAL | Status: DC

## 2020-08-12 MED ORDER — DEXAMETHASONE SODIUM PHOSPHATE 4 MG/ML IJ SOLN
4.0000 mg | Freq: Once | INTRAMUSCULAR | Status: AC
  Administered 2020-08-12: 4 mg via INTRAVENOUS
  Filled 2020-08-12: qty 1

## 2020-08-12 MED ORDER — METOPROLOL TARTRATE 5 MG/5ML IV SOLN
5.0000 mg | Freq: Once | INTRAVENOUS | Status: AC
  Administered 2020-08-12: 5 mg via INTRAVENOUS
  Filled 2020-08-12: qty 5

## 2020-08-12 MED ORDER — LEVETIRACETAM IN NACL 500 MG/100ML IV SOLN
500.0000 mg | Freq: Once | INTRAVENOUS | Status: AC
  Administered 2020-08-12: 500 mg via INTRAVENOUS
  Filled 2020-08-12: qty 100

## 2020-08-12 MED ORDER — SODIUM CHLORIDE 0.9 % IV SOLN: INTRAVENOUS | Status: DC

## 2020-08-12 MED ORDER — LORAZEPAM 2 MG/ML IJ SOLN
1.0000 mg | INTRAMUSCULAR | Status: DC | PRN
  Administered 2020-08-12: 1 mg via INTRAVENOUS
  Filled 2020-08-12: qty 1

## 2020-08-12 MED ORDER — SODIUM CHLORIDE 0.9 % IV SOLN
200.0000 mg | Freq: Once | INTRAVENOUS | Status: AC
  Administered 2020-08-12: 200 mg via INTRAVENOUS
  Filled 2020-08-12: qty 20

## 2020-08-12 MED ORDER — ONDANSETRON HCL 4 MG/2ML IJ SOLN: 4.0000 mg | Freq: Four times a day (QID) | INTRAMUSCULAR | Status: DC | PRN

## 2020-08-12 MED ORDER — DEXAMETHASONE 4 MG PO TABS
4.0000 mg | ORAL_TABLET | Freq: Two times a day (BID) | ORAL | Status: DC
  Administered 2020-08-13: 4 mg via ORAL
  Filled 2020-08-12: qty 1

## 2020-08-12 MED ORDER — LEVETIRACETAM 500 MG PO TABS
500.0000 mg | ORAL_TABLET | Freq: Two times a day (BID) | ORAL | Status: DC
  Administered 2020-08-13: 500 mg via ORAL
  Filled 2020-08-12: qty 1

## 2020-08-12 MED ORDER — ASPIRIN 81 MG PO CHEW
81.0000 mg | CHEWABLE_TABLET | Freq: Every day | ORAL | Status: DC
  Administered 2020-08-13: 81 mg via ORAL
  Filled 2020-08-12: qty 1

## 2020-08-12 MED ORDER — ONDANSETRON HCL 4 MG PO TABS
4.0000 mg | ORAL_TABLET | Freq: Four times a day (QID) | ORAL | Status: DC | PRN
Start: 2020-08-12 — End: 2020-08-13

## 2020-08-12 MED ORDER — LEVETIRACETAM IN NACL 1500 MG/100ML IV SOLN
1500.0000 mg | Freq: Once | INTRAVENOUS | Status: AC
  Administered 2020-08-12: 1500 mg via INTRAVENOUS
  Filled 2020-08-12: qty 100

## 2020-08-12 MED ORDER — ACETAMINOPHEN 650 MG RE SUPP: 650.0000 mg | Freq: Four times a day (QID) | RECTAL | Status: DC | PRN

## 2020-08-12 MED ORDER — MEMANTINE HCL 5 MG PO TABS
5.0000 mg | ORAL_TABLET | Freq: Every day | ORAL | Status: DC
  Administered 2020-08-13: 5 mg via ORAL
  Filled 2020-08-12: qty 1

## 2020-08-12 MED ORDER — METOPROLOL TARTRATE 25 MG PO TABS
25.0000 mg | ORAL_TABLET | Freq: Two times a day (BID) | ORAL | Status: DC
  Administered 2020-08-13: 25 mg via ORAL
  Filled 2020-08-12: qty 1

## 2020-08-12 NOTE — ED Notes (Addendum)
Attempted to call report, RN unable to take report at this time.

## 2020-08-12 NOTE — H&P (Signed)
History and Physical    Robert Peters. FGH:[id number] DOB: 01-07-1956 DOA: 08/12/2020  PCP: Robert Amel, MD   Patient coming from: Home.  I have personally briefly reviewed patient's old medical records in [place]  Chief Complaint: Seizures.  HPI: Robert Peters. is a 65 y.o. 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  He is unable to provide much information at this time, but per patient's wife earlier this month around 07/22/2020 the patient began having right foot weakness, which has progressed to right hemiparesis associated with frequent motor aphasia.  His mental status also has declined to a certain degree, but she states that he has being able to understand her for the most part.  He had an brain MRI on 08/04/2020 which demonstrated metastasis.  He has been subsequently started on dexamethasone and just had his first head radiation therapy earlier today.  There has not been any fever, rigors or diaphoresis.  No obvious dyspnea, complaints of back, chest or abdominal pain.  His appetite has been fair.  ED Course: Initial vital signs were temperature 97.7 F, pulse 86, respiration 18, BP 127/97 mmHg O2 sat 95% on room air.  The patient was loaded with Keppra and Vimpat in the emergency department.  He also received a milligram of lorazepam.  Lab work: His CBC showed a white count of 12.0, hemoglobin 14.8 g/dL and platelets 292.  Sodium 133, potassium 3.9, chloride 99 and CO2 21 mmol/L.  Glucose 138, BUN 33, creatinine 1.0 and calcium 8.7 mg/dL.  Imaging: One-view portable chest radiograph shows low volumes and atelectasis more pronounced on the left than the right.  Pelvics portable x-ray  did not have any acute abnormalities.  C-spine without contrast no evidence of acute fracture.  CT head multiple known metastasis had not changed when compared to previous MRI.  Please see images and full radiology reports below for further details.  Review of Systems: As per HPI otherwise all other systems reviewed and are negative.  Past Medical History:  Diagnosis Date  . Bladder cancer ([place])   . CAD (coronary artery disease)   . Cancer [facility])    Prostate and Bladder Ca  . DVT (deep vein thrombosis) in pregnancy   . Hyperlipidemia    Past Surgical History:  Procedure Laterality Date  . CARDIAC CATHETERIZATION  12/27/2019  . Cystectomy with Ileal Conduit  12/2017  . CYSTOSCOPY  11/2017  . LEFT HEART CATH AND CORONARY ANGIOGRAPHY N/A 12/27/2019   Procedure: LEFT HEART CATH AND CORONARY ANGIOGRAPHY and possible angioplasty;  Surgeon: Adrian Prows, MD;  Location: [facility];  Service: Cardiovascular;  Laterality: N/A;  . Nephrostomy tube with Drain  12/2018  . Percutaneous Nephrosotomy Tube  2019  . Radical Prostatectomy  02/2019  . WISDOM TOOTH EXTRACTION     Social History  reports that he quit smoking about 46 years ago. His smoking use included cigarettes. He smoked 1.00 pack per day. He has never used smokeless tobacco. He reports that he does not drink alcohol and does not use drugs.  Allergies  Allergen Reactions  . Avelumab Other (See Comments)    Hypertension, shakes, chills, and creatine went up.   Family History  Problem Relation Age of  Onset  . Stroke Mother   . Hypertension Mother   . Valvular heart disease Father   . Hypertension Brother    Prior to Admission medications   Medication Sig Start Date End Date Taking? Authorizing Provider  aspirin (ASPIRIN CHILDRENS) 81 MG chewable tablet Chew 1 tablet (81 mg total) by mouth daily. 01/16/20  Yes Adrian Prows, MD  dexamethasone (DECADRON) 2 MG tablet Take 4 mg by mouth 2 (two) times daily.   Yes [provider]  levothyroxine (SYNTHROID) 112 MCG tablet Take 112 mcg by mouth in the morning.  09/18/19  Yes [provider]  memantine (NAMENDA) 5 MG tablet Take 5 mg by mouth daily. 08/11/20  Yes [provider]  metoprolol tartrate (LOPRESSOR) 25 MG tablet TAKE 1 TABLET BY MOUTH TWICE A DAY 06/23/20  Yes Adrian Prows, MD  atorvastatin (LIPITOR) 80 MG tablet Take 1 tablet (80 mg total) by mouth daily. Patient not taking: Reported on 08/12/2020 02/26/20   Adrian Prows, MD  nitroGLYCERIN (NITROSTAT) 0.4 MG SL tablet Place 1 tablet (0.4 mg total) under the tongue every 5 (five) minutes x 3 doses as needed for chest pain. Patient not taking: Reported on 08/12/2020 12/27/19   Adrian Prows, MD   Physical Exam: Vitals:   08/12/20 2100 08/12/20 2130 08/12/20 2200 08/12/20 2215  BP: 124/90 126/81 (!) 117/94 (!) 150/104  Pulse: 69 65 (!) 58 71  Resp: (!) 23 15 20 19   Temp:    (!) 97.4 F (36.3 C)  TempSrc:    Oral  SpO2: 94% 95% 97% 96%  Weight:      Height:       Constitutional: Sedated, but arousable. Eyes: PERRL, lids and conjunctivae mildly injected. ENMT: Mucous membranes are dry. Posterior pharynx clear of any exudate or lesions. Neck: normal, supple, no masses, no thyromegaly Respiratory: Decreased breath sounds in bases, otherwise clear to auscultation bilaterally, no wheezing, no crackles. Normal respiratory effort. No accessory muscle use.  Cardiovascular: Regular rate and rhythm, no murmurs / rubs / gallops. No extremity edema. 2+ pedal pulses. No carotid bruits.  Abdomen: RLQ urostomy and urostomy bag present.  No distention.  Bowel sounds positive.  Soft, no tenderness, no masses palpated. No hepatosplenomegaly. Musculoskeletal: no clubbing / cyanosis.  Good ROM, no contractures.  Right side extremities showed decreased muscle tone.  Skin: no rashes, lesions, ulcers on very limited dermatological examination. Neurologic: CN 2-12 grossly intact. Sensation intact, DTR normal.  Strength positive right-sided hemiparesis.   Psychiatric: Sedated, but briefly arousable.  Remedicated due to seizure-like activity.  Unable to fully evaluate.  Labs on Admission: I have personally reviewed following labs and imaging studies  CBC: Recent Labs  Lab 08/12/20 1940  WBC 12.0*  NEUTROABS 10.1*  HGB 14.8  HCT 45.3  MCV 90.8  PLT 768    Basic Metabolic Panel: Recent Labs  Lab 08/12/20 1940  NA 133*  K 3.9  CL 99  CO2 21*  GLUCOSE 138*  BUN 33*  CREATININE 1.00  CALCIUM 8.7*  MG 2.1    GFR: Estimated Creatinine Clearance: 80.8 mL/min (by C-G formula based on SCr of 1 mg/dL).  Liver Function Tests: No results for input(s): AST, ALT, ALKPHOS, BILITOT, PROT, ALBUMIN in the last 168 hours.  Radiological Exams on Admission: CT Head Wo Contrast  Result Date: 08/12/2020 CLINICAL DATA:  Seizure, nontraumatic. History of brain metastases, new onset seizure, altered mental status. EXAM: CT HEAD WITHOUT CONTRAST CT CERVICAL SPINE WITHOUT CONTRAST TECHNIQUE: Multidetector CT  imaging of the head and cervical spine was performed following the standard protocol without intravenous contrast. Multiplanar CT image reconstructions of the cervical spine were also generated. COMPARISON:  Brain MRI 08/04/2020. FINDINGS: CT HEAD FINDINGS Brain: Mild cerebral atrophy. Multiple known supratentorial brain metastases have not appreciably changed as compared to the recent prior brain MRI of 08/04/2020. Unchanged vasogenic edema surrounding these lesions, most notably within the left frontal, parietal and temporal lobes. Unchanged 6 mm rightward midline shift measured at the level of the septum pellucidum (series 5, image 30). Unchanged prominence of the left lateral ventricle temporal horn likely reflecting some degree of ventricular entrapment. No evidence of acute intracranial hemorrhage. No acute demarcated cortical infarct is identified. No extra-axial fluid collection. Vascular: Hyperdense  vessel.  Atherosclerotic calcifications Skull: Normal. Negative for fracture or focal lesion. Sinuses/Orbits: Visualized orbits show no acute finding. Mild bilateral ethmoid sinus mucosal thickening. Small mucous retention cysts within the right maxillary sinus. CT CERVICAL SPINE FINDINGS Alignment: No significant spondylolisthesis. Skull base and vertebrae: The basion-dental and atlanto-dental intervals are maintained.No evidence of acute fracture to the cervical spine. Soft tissues and spinal canal: No prevertebral fluid or swelling. No visible canal hematoma. Disc levels: Cervical spondylosis. No more than mild disc space narrowing. Shallow multilevel disc bulges/central disc protrusions. No more than mild relative spinal canal narrowing appreciated at any level. No significant bony neural foraminal narrowing. Upper chest: No consolidation within the imaged lung apices. No visible pneumothorax. IMPRESSION: CT head: 1. Multiple known supratentorial brain metastases have not appreciably changed as compared to the recent prior brain MRI of 08/04/2020. Unchanged vasogenic edema surrounding these lesions, most notably within the left frontal, parietal and temporal lobes. 2. Unchanged mass effect with 6 mm rightward midline shift and partial effacement of the left lateral ventricle. 3. Unchanged prominence of the left lateral ventricle temporal horn, likely reflecting some degree of ventricular entrapment. 4. Paranasal sinus disease, as described. CT cervical spine: 1. No evidence of acute fracture to the cervical spine. 2. Cervical spondylosis, as described. Electronically Signed   By: Kellie Simmering DO   On: 08/12/2020 20:21   CT Cervical Spine Wo Contrast  Result Date: 08/12/2020 CLINICAL DATA:  Seizure, nontraumatic. History of brain metastases, new onset seizure, altered mental status. EXAM: CT HEAD WITHOUT CONTRAST CT CERVICAL SPINE WITHOUT CONTRAST TECHNIQUE: Multidetector CT imaging of the head and cervical  spine was performed following the standard protocol without intravenous contrast. Multiplanar CT image reconstructions of the cervical spine were also generated. COMPARISON:  Brain MRI 08/04/2020. FINDINGS: CT HEAD FINDINGS Brain: Mild cerebral atrophy. Multiple known supratentorial brain metastases have not appreciably changed as compared to the recent prior brain MRI of 08/04/2020. Unchanged vasogenic edema surrounding these lesions, most notably within the left frontal, parietal and temporal lobes. Unchanged 6 mm rightward midline shift measured at the level of the septum pellucidum (series 5, image 30). Unchanged prominence of the left lateral ventricle temporal horn likely reflecting some degree of ventricular entrapment. No evidence of acute intracranial hemorrhage. No acute demarcated cortical infarct is identified. No extra-axial fluid collection. Vascular: Hyperdense vessel.  Atherosclerotic calcifications Skull: Normal. Negative for fracture or focal lesion. Sinuses/Orbits: Visualized orbits show no acute finding. Mild bilateral ethmoid sinus mucosal thickening. Small mucous retention cysts within the right maxillary sinus. CT CERVICAL SPINE FINDINGS Alignment: No significant spondylolisthesis. Skull base and vertebrae: The basion-dental and atlanto-dental intervals are maintained.No evidence of acute fracture to the cervical spine. Soft tissues and spinal canal: No prevertebral fluid  or swelling. No visible canal hematoma. Disc levels: Cervical spondylosis. No more than mild disc space narrowing. Shallow multilevel disc bulges/central disc protrusions. No more than mild relative spinal canal narrowing appreciated at any level. No significant bony neural foraminal narrowing. Upper chest: No consolidation within the imaged lung apices. No visible pneumothorax. IMPRESSION: CT head: 1. Multiple known supratentorial brain metastases have not appreciably changed as compared to the recent prior brain MRI of  08/04/2020. Unchanged vasogenic edema surrounding these lesions, most notably within the left frontal, parietal and temporal lobes. 2. Unchanged mass effect with 6 mm rightward midline shift and partial effacement of the left lateral ventricle. 3. Unchanged prominence of the left lateral ventricle temporal horn, likely reflecting some degree of ventricular entrapment. 4. Paranasal sinus disease, as described. CT cervical spine: 1. No evidence of acute fracture to the cervical spine. 2. Cervical spondylosis, as described. Electronically Signed   By: Kellie Simmering DO   On: 08/12/2020 20:21   DG Pelvis Portable  Result Date: 08/12/2020 CLINICAL DATA:  Fall. EXAM: PORTABLE PELVIS 1-2 VIEWS COMPARISON:  None. FINDINGS: There is no evidence of pelvic fracture or diastasis. No pelvic bone lesions are seen. IMPRESSION: Negative. Electronically Signed   By: Marijo Conception M.D.   On: 08/12/2020 20:17   DG Chest Portable 1 View  Result Date: 08/12/2020 CLINICAL DATA:  Fall, seizure, concern for trauma EXAM: PORTABLE CHEST 1 VIEW COMPARISON:  Radiograph 12/27/2019 FINDINGS: Low lung volumes. Streaky basilar opacities are present in the lung bases more focal density in left lung base possibly reflecting further subsegmental atelectatic change or aspiration in the setting of seizure. No pneumothorax. No effusion. Cardiomediastinal contours are similar to prior counting for differences in technique. Telemetry leads overlie the chest. Cervical stabilization collar in place. IMPRESSION: Low volumes and atelectasis. More focal opacity in left lung base, possible subsegmental atelectasis though cannot exclude underlying airspace process such as aspiration in the setting of seizure. No acute traumatic abnormality of the chest wall. Electronically Signed   By: Lovena Le M.D.   On: 08/12/2020 20:17   Left Heart Catheterization 12/27/19:  LV: Normal LV systolic function.  No regional wall motion abnormality.  Normal LVEDP.   No pressure gradient across the aortic valve. Left main: Normal. LAD: There is a ulcerated plaque in the mid LAD.  Nonflow limiting.  There is no contrast hung up.  Post nitroglycerin does not reveal any significant stenosis. Circumflex: Moderate sized vessel, mild proximal disease of 20%. Ramus intermediate: Mild disease in the proximal segment of 10 to 20%, mid segment has a 30% stenosis. RCA: Dominant.  There is a separate ostia for conus branch and AV nodal branch.  There is mild disease in the proximal RCA of 20%.  PDA ostium has about a 20 to 30% stenosis.  Impression: The culprit lesion appears to be mid LAD.  However there is very brisk flow and the ulceration does not appear to be any complex.  There is TIMI-3 flow noted.  Can be treated medically, can be discharged home with Xarelto (Prior DVT) starting tonight and add Plavix 75 mg daily.   He has been started on low-dose beta-blocker therapy along with high intensity high-dose atorvastatin at 80 mg daily, will continue the same.  55 mill contrast utilized.  Echocardiogram 01/17/2020:  Normal LV systolic function with visual EF 50-55%. Left ventricle cavity  is normal in size. Normal global wall motion. Normal diastolic filling  pattern, normal LAP.  Mild (Grade I)  mitral regurgitation.  Mild tricuspid regurgitation. No evidence of pulmonary hypertension.  IVC is dilated with blunted respiratory response.  Compared to prior study dated 07/22/2019: no significant change.   EKG: Independently reviewed. Vent. rate 86 BPM PR interval 175 ms QRS duration 97 ms QT/QTcB 387/463 ms P-R-T axes 71 60 57 Sinus rhythm Consider left atrial enlargement  Assessment/Plan Principal Problem:   Seizure disorder (HCC) in the setting of Brain metastasis from bladder cancer (HCC) Observation/telemetry. Frequent neurochecks. Seizure precautions. Continuous EEG. Keppra IV loading done. Continue Keppra 500 mg p.o. twice daily. Vimpat per  neurology. Lorazepam as needed for seizure activity.  Active Problems:   Volume depletion Continue IVF.    Leukocytosis Currently on dexamethasone.    Hypocalcemia Recheck along with albumin level in AM.    Hyponatremia Minimal.  Corrected as 134 mmol/L. Secondary to urinary losses. Continue IV fluids.    Hyperglycemia On dexamethasone. Monitor glucose level.    Mild hyperlipidemia Atorvastatin has been held.    CAD (coronary artery disease) Nonobstructive. Has recently stopped atorvastatin. Continue metoprolol and aspirin.    DVT prophylaxis: SCDs. Code Status:   Full code. Family Communication:  His wife Eustaquio Maize was by bedside and provided HPI. Disposition Plan:   Patient is from:  Home.  Anticipated DC to:  Home.  Anticipated DC date:  08/13/2020 or 08/14/2020.  Anticipated DC barriers: Clinical status and consultant sign off.  Consults called:  Neuro hospitalist team. Admission status:  Observation/progressive unit.  Severity of Illness:  High severity in the setting of multiple seizure episodes due to metastatic bladder cancer.  The patient will need to remain least overnight for close EEG and clinical monitoring.  Reubin Milan MD [facility]  How to contact the [facility] Attending or Consulting provider [place] or covering provider during after hours [place], for this patient?   1. Check the care team in [facility] and look for a) attending/consulting [facility] provider listed and b) the [facility] team listed 2. Log into [email or web address] and use [facility]'s universal password to access. If you do not have the password, please contact the hospital operator. 3. Locate the [facility] provider you are looking for under [facility] and page to a number that you can be directly reached. 4. If you still have difficulty reaching the provider, please page the [facility] (Director on Call) for the Hospitalists listed on amion for assistance.  08/12/2020, 10:34 PM   This document was  prepared using Dragon voice recognition software and may contain some unintended transcription errors.

## 2020-08-12 NOTE — ED Provider Notes (Signed)
[facility] EMERGENCY DEPARTMENT Provider Note   CSN: [id number] Arrival date & time: 08/12/20  1925     History Chief Complaint  Patient presents with  . Seizures    Robert Peters. is a 65 y.o. male.  Past medical history of bladder cancer, metastases to brain, CAD, DVT.  Additional history obtained through chart review.  Patient has a history of bladder cancer, recently diagnosed metastases to brain.  Just started on radiation therapy.  Has had some cognitive decline over the past couple months, also noted to have right leg weakness/foot drop.  Recent outpatient MRI showed extensive brain metastatic disease affecting left greater than right hemispheres.  While at home today patient had a seizure episode.  Unconscious, generalized shaking.  Fell from standing, unsure of major head injury.  EMS report patient had 2 additional seizure episodes with them.  The second seizure episode with EMS required Versed to stop.  Postictal.   MRI Brain on 5/20 showed - IMPRESSION:  Multifocal supratentorial intraparenchymal metastatic disease affecting the  left greater than right hemispheres. There is resulting 6 mm of  left-to-right midline shift. These findings are not significantly changed  from recent reference prior MRI from 08/04/2020.    HPI     Past Medical History:  Diagnosis Date  . Bladder cancer ([place])   . CAD (coronary artery disease)   . Cancer [facility])    Prostate and Bladder Ca  . DVT (deep vein thrombosis) in pregnancy   . Hyperlipidemia     Patient Active Problem List   Diagnosis Date Noted  . Seizure disorder ([place]) 08/12/2020  . Unstable angina ([place]) 12/27/2019  . Mild hyperlipidemia     Past Surgical History:  Procedure Laterality Date  . CARDIAC CATHETERIZATION  12/27/2019  . Cystectomy with Ileal Conduit  12/2017  . CYSTOSCOPY  11/2017  . LEFT HEART CATH AND CORONARY ANGIOGRAPHY N/A 12/27/2019   Procedure: LEFT HEART CATH AND CORONARY  ANGIOGRAPHY and possible angioplasty;  Surgeon: Adrian Prows, MD;  Location: [facility];  Service: Cardiovascular;  Laterality: N/A;  . Nephrostomy tube with Drain  12/2018  . Percutaneous Nephrosotomy Tube  2019  . Radical Prostatectomy  02/2019  . WISDOM TOOTH EXTRACTION         Family History  Problem Relation Age of Onset  . Stroke Mother   . Hypertension Mother   . Valvular heart disease Father   . Hypertension Brother     Social History   Tobacco Use  . Smoking status: Former Smoker    Packs/day: 1.00    Types: Cigarettes    Quit date: 12/26/1973    Years since quitting: 46.6  . Smokeless tobacco: Never Used  Vaping Use  . Vaping Use: Never used  Substance Use Topics  . Alcohol use: Never  . Drug use: Never    Home Medications Prior to Admission medications   Medication Sig Start Date End Date Taking? Authorizing Provider  aspirin (ASPIRIN CHILDRENS) 81 MG chewable tablet Chew 1 tablet (81 mg total) by mouth daily. 01/16/20  Yes Adrian Prows, MD  dexamethasone (DECADRON) 2 MG tablet Take 4 mg by mouth 2 (two) times daily.   Yes [provider]  levothyroxine (SYNTHROID) 112 MCG tablet Take 112 mcg by mouth in the morning.  09/18/19  Yes [provider]  memantine (NAMENDA) 5 MG tablet Take 5 mg by mouth daily. 08/11/20  Yes [provider]  metoprolol tartrate (LOPRESSOR) 25 MG  tablet TAKE 1 TABLET BY MOUTH TWICE A DAY 06/23/20  Yes Adrian Prows, MD  atorvastatin (LIPITOR) 80 MG tablet Take 1 tablet (80 mg total) by mouth daily. Patient not taking: Reported on 08/12/2020 02/26/20   Adrian Prows, MD  nitroGLYCERIN (NITROSTAT) 0.4 MG SL tablet Place 1 tablet (0.4 mg total) under the tongue every 5 (five) minutes x 3 doses as needed for chest pain. Patient not taking: Reported on 08/12/2020 12/27/19   Adrian Prows, MD    Allergies    Avelumab  Review of Systems   Review of Systems  Unable to perform ROS: Mental status change    Physical  Exam Updated Vital Signs BP 126/81   Pulse 65   Temp 97.7 F (36.5 C) (Oral)   Resp 15   Ht 5\' 8"  (1.727 m)   Wt 91.5 kg   SpO2 95%   BMI 30.67 kg/m   Physical Exam Vitals and nursing note reviewed.  Constitutional:      Appearance: He is well-developed.     Comments: Mildly lethargic but easily aroused  HENT:     Head: Normocephalic and atraumatic.  Eyes:     Conjunctiva/sclera: Conjunctivae normal.  Cardiovascular:     Rate and Rhythm: Normal rate and regular rhythm.     Heart sounds: No murmur heard.   Pulmonary:     Effort: Pulmonary effort is normal. No respiratory distress.     Breath sounds: Normal breath sounds.  Abdominal:     Palpations: Abdomen is soft.     Tenderness: There is no abdominal tenderness.  Musculoskeletal:     Cervical back: Neck supple.  Skin:    General: Skin is warm and dry.  Neurological:     General: No focal deficit present.     Comments: Opens eyes, follows commands, answers basic questions but unable to provide any complex medical history  Psychiatric:     Comments: Flattened affect     ED Results / Procedures / Treatments   Labs (all labs ordered are listed, but only abnormal results are displayed) Labs Reviewed  CBC WITH DIFFERENTIAL/PLATELET - Abnormal; Notable for the following components:      Result Value   WBC 12.0 (*)    Neutro Abs 10.1 (*)    Abs Immature Granulocytes 0.31 (*)    All other components within normal limits  BASIC METABOLIC PANEL - Abnormal; Notable for the following components:   Sodium 133 (*)    CO2 21 (*)    Glucose, Bld 138 (*)    BUN 33 (*)    Calcium 8.7 (*)    All other components within normal limits  SARS CORONAVIRUS 2 (TAT 6-24 HRS)  MAGNESIUM    EKG None  Radiology CT Head Wo Contrast  Result Date: 08/12/2020 CLINICAL DATA:  Seizure, nontraumatic. History of brain metastases, new onset seizure, altered mental status. EXAM: CT HEAD WITHOUT CONTRAST CT CERVICAL SPINE WITHOUT  CONTRAST TECHNIQUE: Multidetector CT imaging of the head and cervical spine was performed following the standard protocol without intravenous contrast. Multiplanar CT image reconstructions of the cervical spine were also generated. COMPARISON:  Brain MRI 08/04/2020. FINDINGS: CT HEAD FINDINGS Brain: Mild cerebral atrophy. Multiple known supratentorial brain metastases have not appreciably changed as compared to the recent prior brain MRI of 08/04/2020. Unchanged vasogenic edema surrounding these lesions, most notably within the left frontal, parietal and temporal lobes. Unchanged 6 mm rightward midline shift measured at the level of the septum pellucidum (series 5, image  30). Unchanged prominence of the left lateral ventricle temporal horn likely reflecting some degree of ventricular entrapment. No evidence of acute intracranial hemorrhage. No acute demarcated cortical infarct is identified. No extra-axial fluid collection. Vascular: Hyperdense vessel.  Atherosclerotic calcifications Skull: Normal. Negative for fracture or focal lesion. Sinuses/Orbits: Visualized orbits show no acute finding. Mild bilateral ethmoid sinus mucosal thickening. Small mucous retention cysts within the right maxillary sinus. CT CERVICAL SPINE FINDINGS Alignment: No significant spondylolisthesis. Skull base and vertebrae: The basion-dental and atlanto-dental intervals are maintained.No evidence of acute fracture to the cervical spine. Soft tissues and spinal canal: No prevertebral fluid or swelling. No visible canal hematoma. Disc levels: Cervical spondylosis. No more than mild disc space narrowing. Shallow multilevel disc bulges/central disc protrusions. No more than mild relative spinal canal narrowing appreciated at any level. No significant bony neural foraminal narrowing. Upper chest: No consolidation within the imaged lung apices. No visible pneumothorax. IMPRESSION: CT head: 1. Multiple known supratentorial brain metastases have not  appreciably changed as compared to the recent prior brain MRI of 08/04/2020. Unchanged vasogenic edema surrounding these lesions, most notably within the left frontal, parietal and temporal lobes. 2. Unchanged mass effect with 6 mm rightward midline shift and partial effacement of the left lateral ventricle. 3. Unchanged prominence of the left lateral ventricle temporal horn, likely reflecting some degree of ventricular entrapment. 4. Paranasal sinus disease, as described. CT cervical spine: 1. No evidence of acute fracture to the cervical spine. 2. Cervical spondylosis, as described. Electronically Signed   By: Kellie Simmering DO   On: 08/12/2020 20:21   CT Cervical Spine Wo Contrast  Result Date: 08/12/2020 CLINICAL DATA:  Seizure, nontraumatic. History of brain metastases, new onset seizure, altered mental status. EXAM: CT HEAD WITHOUT CONTRAST CT CERVICAL SPINE WITHOUT CONTRAST TECHNIQUE: Multidetector CT imaging of the head and cervical spine was performed following the standard protocol without intravenous contrast. Multiplanar CT image reconstructions of the cervical spine were also generated. COMPARISON:  Brain MRI 08/04/2020. FINDINGS: CT HEAD FINDINGS Brain: Mild cerebral atrophy. Multiple known supratentorial brain metastases have not appreciably changed as compared to the recent prior brain MRI of 08/04/2020. Unchanged vasogenic edema surrounding these lesions, most notably within the left frontal, parietal and temporal lobes. Unchanged 6 mm rightward midline shift measured at the level of the septum pellucidum (series 5, image 30). Unchanged prominence of the left lateral ventricle temporal horn likely reflecting some degree of ventricular entrapment. No evidence of acute intracranial hemorrhage. No acute demarcated cortical infarct is identified. No extra-axial fluid collection. Vascular: Hyperdense vessel.  Atherosclerotic calcifications Skull: Normal. Negative for fracture or focal lesion.  Sinuses/Orbits: Visualized orbits show no acute finding. Mild bilateral ethmoid sinus mucosal thickening. Small mucous retention cysts within the right maxillary sinus. CT CERVICAL SPINE FINDINGS Alignment: No significant spondylolisthesis. Skull base and vertebrae: The basion-dental and atlanto-dental intervals are maintained.No evidence of acute fracture to the cervical spine. Soft tissues and spinal canal: No prevertebral fluid or swelling. No visible canal hematoma. Disc levels: Cervical spondylosis. No more than mild disc space narrowing. Shallow multilevel disc bulges/central disc protrusions. No more than mild relative spinal canal narrowing appreciated at any level. No significant bony neural foraminal narrowing. Upper chest: No consolidation within the imaged lung apices. No visible pneumothorax. IMPRESSION: CT head: 1. Multiple known supratentorial brain metastases have not appreciably changed as compared to the recent prior brain MRI of 08/04/2020. Unchanged vasogenic edema surrounding these lesions, most notably within the left frontal, parietal and temporal lobes.  2. Unchanged mass effect with 6 mm rightward midline shift and partial effacement of the left lateral ventricle. 3. Unchanged prominence of the left lateral ventricle temporal horn, likely reflecting some degree of ventricular entrapment. 4. Paranasal sinus disease, as described. CT cervical spine: 1. No evidence of acute fracture to the cervical spine. 2. Cervical spondylosis, as described. Electronically Signed   By: Kellie Simmering DO   On: 08/12/2020 20:21   DG Pelvis Portable  Result Date: 08/12/2020 CLINICAL DATA:  Fall. EXAM: PORTABLE PELVIS 1-2 VIEWS COMPARISON:  None. FINDINGS: There is no evidence of pelvic fracture or diastasis. No pelvic bone lesions are seen. IMPRESSION: Negative. Electronically Signed   By: Marijo Conception M.D.   On: 08/12/2020 20:17   DG Chest Portable 1 View  Result Date: 08/12/2020 CLINICAL DATA:  Fall,  seizure, concern for trauma EXAM: PORTABLE CHEST 1 VIEW COMPARISON:  Radiograph 12/27/2019 FINDINGS: Low lung volumes. Streaky basilar opacities are present in the lung bases more focal density in left lung base possibly reflecting further subsegmental atelectatic change or aspiration in the setting of seizure. No pneumothorax. No effusion. Cardiomediastinal contours are similar to prior counting for differences in technique. Telemetry leads overlie the chest. Cervical stabilization collar in place. IMPRESSION: Low volumes and atelectasis. More focal opacity in left lung base, possible subsegmental atelectasis though cannot exclude underlying airspace process such as aspiration in the setting of seizure. No acute traumatic abnormality of the chest wall. Electronically Signed   By: Lovena Le M.D.   On: 08/12/2020 20:17    Procedures Procedures   Medications Ordered in ED Medications  levETIRAcetam (KEPPRA) IVPB 500 mg/100 mL premix (500 mg Intravenous New Bag/Given 08/12/20 2138)  levETIRAcetam (KEPPRA) tablet 500 mg (has no administration in time range)  LORazepam (ATIVAN) injection 1 mg (has no administration in time range)  levETIRAcetam (KEPPRA) IVPB 1500 mg/ 100 mL premix (0 mg Intravenous Stopped 08/12/20 2030)    ED Course  I have reviewed the triage vital signs and the nursing notes.  Pertinent labs & imaging results that were available during my care of the patient were reviewed by me and considered in my medical decision making (see chart for details).    MDM Rules/Calculators/A&P                         65 year old male with bladder cancer, brain metastases presenting to ER with concern for new onset seizures.  3 total episodes prior to arrival.  None here.  Loaded with Keppra.  CT head is stable.  Discussed case with our neurologist, recommend starting on Keppra regimen and admitting for observation.  Discussed with Dr. Olevia Bowens with hospitalist service who will admit.  Wife at bedside  and updated throughout stay.  Final Clinical Impression(s) / ED Diagnoses Final diagnoses:  Seizure ([place])  Brain metastases [facility])    Rx / DC Orders ED Discharge Orders    None       Lucrezia Starch, MD 08/12/20 2139

## 2020-08-12 NOTE — ED Notes (Signed)
Attempted to call report, RN unable to take report at this time.

## 2020-08-12 NOTE — Consult Note (Signed)
NEUROLOGY CONSULTATION NOTE   Date of service: Aug 12, 2020 Patient Name: Robert Peters. MRN:  [id number] DOB:  [birth year] Reason for consult: "seizure x 3" Requesting Provider: Reubin Milan, MD _ _ _   _ __   _ __ _ _  __ __   _ __   __ _  History of Present Illness  Robert C Hale Chalfin. is a 65 y.o. male with PMH significant for bladder cancer with brain mets and recent radiation therapy, CAD, DVT who presents with seizures. EMS called and enroute, had 2 additional GTC seizures. Versed given and patient in the ED with post ictal vs sedation due to Versed.  Recent outpatient MRI Brain per report with multifocal supratentorial intraparenchymal metastatic disease affecting the left greater than right hemispheres. There is resulting 6 mm of left-to-right midline shift. These findings are not significantly changed from recent reference prior MRI from 08/04/2020.  He was given Keppra 1500mg  IV once in the ED and admitted for observation overnight given seizure clustering.  On my evaluation, patient has significant aphasia, persverates and unable to provide any meaningful history. Wife reports baseline aphasia but still able to communicate through it somewhat, he is paralyzed on his right from the bran mets. Had his first whole brain radiation today. They were in the yard when he had a brief 15 secs GTC with eyes open and full body jerking, was sleepy afterwards but did seem to open eyes to voice. He had another GTC when the EMS got there and one more enroute. Wife reports that the aphasia seems worse here.  Of note, when I was in the room, I noted a brief episode of R gaze deviation with twitching eye movement and not responding to commands with very subtle head deviation to the Right, concerning for a focal seizure. Ativan 1mg  given and Lacosamide 200mg  IV once given.   ROS   Unable to obtain ROS due to aphasia.  Past History   Past Medical History:  Diagnosis Date  . Bladder  cancer ([place])   . CAD (coronary artery disease)   . Cancer [facility])    Prostate and Bladder Ca  . DVT (deep vein thrombosis) in pregnancy   . Hyperlipidemia    Past Surgical History:  Procedure Laterality Date  . CARDIAC CATHETERIZATION  12/27/2019  . Cystectomy with Ileal Conduit  12/2017  . CYSTOSCOPY  11/2017  . LEFT HEART CATH AND CORONARY ANGIOGRAPHY N/A 12/27/2019   Procedure: LEFT HEART CATH AND CORONARY ANGIOGRAPHY and possible angioplasty;  Surgeon: Adrian Prows, MD;  Location: [facility];  Service: Cardiovascular;  Laterality: N/A;  . Nephrostomy tube with Drain  12/2018  . Percutaneous Nephrosotomy Tube  2019  . Radical Prostatectomy  02/2019  . WISDOM TOOTH EXTRACTION     Family History  Problem Relation Age of Onset  . Stroke Mother   . Hypertension Mother   . Valvular heart disease Father   . Hypertension Brother    Social History   Socioeconomic History  . Marital status: Married    Spouse name: Not on file  . Number of children: 2  . Years of education: Not on file  . Highest education level: Not on file  Occupational History  . Not on file  Tobacco Use  . Smoking status: Former Smoker    Packs/day: 1.00    Types: Cigarettes    Quit date: 12/26/1973    Years since quitting: 46.6  . Smokeless tobacco: Never  Used  Vaping Use  . Vaping Use: Never used  Substance and Sexual Activity  . Alcohol use: Never  . Drug use: Never  . Sexual activity: Not Currently  Other Topics Concern  . Not on file  Social History Narrative  . Not on file   Social Determinants of Health   Financial Resource Strain: Not on file  Food Insecurity: Not on file  Transportation Needs: Not on file  Physical Activity: Not on file  Stress: Not on file  Social Connections: Not on file   Allergies  Allergen Reactions  . Avelumab Other (See Comments)    Hypertension, shakes, chills, and creatine went up.    Medications  (Not in a hospital admission)    Vitals    Vitals:   08/12/20 2000 08/12/20 2030 08/12/20 2100 08/12/20 2130  BP: (!) 142/93 (!) 140/95 124/90 126/81  Pulse: 83 75 69 65  Resp: 15 14 (!) 23 15  Temp:      TempSrc:      SpO2: 95% 96% 94% 95%  Weight:      Height:         Body mass index is 30.67 kg/m.  Physical Exam   General: Laying comfortably in bed; in no acute distress. HENT: Normal oropharynx and mucosa. Normal external appearance of ears and nose. Neck: Supple, no pain or tenderness CV: No JVD. No peripheral edema. Pulmonary: Symmetric Chest rise. Normal respiratory effort. Abdomen: Soft to touch, non-tender. Ext: No cyanosis, edema, or deformity Skin: No rash. Normal palpation of skin.  Musculoskeletal: Normal digits and nails by inspection. No clubbing.   Neurologic Examination  Mental status/Cognition: Alert, unable to assess orientation 2/2 aphasia. Speech/language: Fluent, unable to comprehend, unable to name objects, does appear to mimic and able to repeat intermittently.  Perseverates and keeps repeating yes multiple times.  Cranial nerves:   CN II Pupils equal and reactive to light, no VF deficits    CN III,IV,VI EOM intact, no gaze preference or deviation, no nystagmus    CN V normal sensation in V1, V2, and V3 segments bilaterally    CN VII no asymmetry, no nasolabial fold flattening    CN VIII normal hearing to speech    CN IX & X normal palatal elevation, no uvular deviation    CN XI 5/5 head turn and 5/5 shoulder shrug bilaterally   CN XII midline tongue protrusion    Motor:  Muscle bulk: normal, tone decreased in RUE and RLE. Unable to do a detailed strength testing due to aphasia.  Holds his left arm up in the air for more than 10 seconds and squeezes fingers when prompted with his left hand.  Wiggles his toes on the left foot and raises his left leg off the bed.  Paralyzed in his right arm and right leg. Reflexes:  Right Left Comments  Pectoralis      Biceps (C5/6) 2 2    Brachioradialis (C5/6) 2 2    Triceps (C6/7) 2 2    Patellar (L3/4) 2 2    Achilles (S1)      Hoffman      Plantar     Jaw jerk    Sensation:  Light touch Does not withdraw to mild pinch on the [place] and RLE. Localizes with LUE and LLE.   Pin prick    Temperature    Vibration   Proprioception    Coordination/Complex Motor:  Unable to assess but no obvious ataxia  Labs  CBC:  Recent Labs  Lab 08/12/20 1940  WBC 12.0*  NEUTROABS 10.1*  HGB 14.8  HCT 45.3  MCV 90.8  PLT 175    Basic Metabolic Panel:  Lab Results  Component Value Date   NA 133 (L) 08/12/2020   K 3.9 08/12/2020   CO2 21 (L) 08/12/2020   GLUCOSE 138 (H) 08/12/2020   BUN 33 (H) 08/12/2020   CREATININE 1.00 08/12/2020   CALCIUM 8.7 (L) 08/12/2020   GFRNONAA >60 08/12/2020   GFRAA >60 10/19/2017   Lipid Panel:  Lab Results  Component Value Date   [place] 90 12/27/2019   HgbA1c: No results found for: HGBA1C Urine Drug Screen: No results found for: LABOPIA, COCAINSCRNUR, LABBENZ, AMPHETMU, THCU, LABBARB  Alcohol Level No results found for: [facility]  CT Head without contrast:( personally reviewed) 1. Multiple known supratentorial brain metastases have not appreciably changed as compared to the recent prior brain MRI of 08/04/2020. Unchanged vasogenic edema surrounding these lesions, most notably within the left frontal, parietal and temporal lobes. 2. Unchanged mass effect with 6 mm rightward midline shift and partial effacement of the left lateral ventricle. 3. Unchanged prominence of the left lateral ventricle temporal horn, likely reflecting some degree of ventricular entrapment. 4. Paranasal sinus disease, as described.  MRI Brain:( report from 08/04/2020) 1. Multiple brain metastases. Approximately Eight hypercellular masses ranging from 1.8 cm to 4.0 cm diameter. But associated vasogenic edema and mass effect is relatively mild. And while there is 5-6 mL of rightward midline shift the  basilar cisterns remain normal.  Impression   Legion C Garnett Nunziata. is a 65 y.o. 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 His neurologic examination is notable for aphasia and plegic in right upper and right lower extremity.  Wife reports aphasia is much worse than his baseline.  He is plegic on the right at baseline. He dad a very brief subtle clinical episode with right gaze deviation and right head turn while being unresponsive, concerning for a focal seizure.  Minimal postictal period and back to his baseline.  Primary Diagnosis:  Metastatic brain disease Generalized tonic-clonic seizure Focal seizure with preserved awareness Epileptic aphasia Cerebral edema Brain compression  Recommendations  - Keppra 2G IV, followed by Keppra 500mg  BID - Vimpat 200mg  IV once - Ativan 2mg  IV every 15 mins PRN for any seizure activity lasting more than 2 mins - Seizure precautions - cEEG ordered given concern for epileptic aphasia and the noted subtle focal seizure. ______________________________________________________________________  This patient is critically ill and at significant risk of neurological worsening, death and care requires constant monitoring of vital signs, hemodynamics,respiratory and cardiac monitoring, neurological assessment, discussion with family, other specialists and medical decision making of high complexity. I spent 60 minutes of neurocritical care time  in the care of  this patient. This was time spent independent of any time provided by nurse practitioner or PA.  Donnetta Simpers [facility] Pager Number [phone] 08/12/2020  11:24 PM   Thank you for the opportunity to take part in the care of this patient. If you have any further questions, please contact the neurology consultation attending.  Signed,  [place] Pager Number [phone] _ _ _   _ __   _ __ _ _  __ __   _ __   __ _

## 2020-08-12 NOTE — ED Notes (Signed)
Neurologist came out to nursing station stating pt was having a seizure. This RN into room and gave 1mg  ativan. MD at bedside.

## 2020-08-12 NOTE — ED Triage Notes (Addendum)
Pt bib gems after 3 seizures. Pt had witnessed seizure and fell to the concrete and hit head. No blood thinners. PT then had a seizure when EMS arrived lasting approx 1 minute, and then 3rd seizure en route to hospital lasting approx 30 seconds that was stopped by 2.5mg  versed. No hx of seizures. Hx of brain tumor that pt is currently receiving radiation for at [facility]. Pt altered mental status at baseline but wife reports he is more altered after the seizure today. Baseline R sided deficit. C collar in place on arrival.   EMS vitals:  BP: 130/88 P: 88 RR: 24  Spo2: 100 RA  CBG: 131

## 2020-08-13 ENCOUNTER — Observation Stay (HOSPITAL_COMMUNITY): Payer: Medicare Other

## 2020-08-13 ENCOUNTER — Other Ambulatory Visit (HOSPITAL_COMMUNITY): Payer: Self-pay

## 2020-08-13 ENCOUNTER — Ambulatory Visit: Payer: Medicare Other | Admitting: Oncology

## 2020-08-13 DIAGNOSIS — C799 Secondary malignant neoplasm of unspecified site: Secondary | ICD-10-CM | POA: Diagnosis present

## 2020-08-13 DIAGNOSIS — C679 Malignant neoplasm of bladder, unspecified: Secondary | ICD-10-CM | POA: Diagnosis present

## 2020-08-13 DIAGNOSIS — R739 Hyperglycemia, unspecified: Secondary | ICD-10-CM | POA: Diagnosis present

## 2020-08-13 DIAGNOSIS — G40909 Epilepsy, unspecified, not intractable, without status epilepticus: Secondary | ICD-10-CM | POA: Diagnosis not present

## 2020-08-13 LAB — COMPREHENSIVE METABOLIC PANEL
ALT: 27 U/L (ref 0–44)
AST: 23 U/L (ref 15–41)
Albumin: 3.2 g/dL — ABNORMAL LOW (ref 3.5–5.0)
Alkaline Phosphatase: 56 U/L (ref 38–126)
Anion gap: 12 (ref 5–15)
BUN: 29 mg/dL — ABNORMAL HIGH (ref 8–23)
CO2: 23 mmol/L (ref 22–32)
Calcium: 9 mg/dL (ref 8.9–10.3)
Chloride: 103 mmol/L (ref 98–111)
Creatinine, Ser: 0.89 mg/dL (ref 0.61–1.24)
GFR, Estimated: >60 mL/min (ref >60)
Glucose, Bld: 128 mg/dL — ABNORMAL HIGH (ref 70–99)
Potassium: 4.2 mmol/L (ref 3.5–5.1)
Sodium: 138 mmol/L (ref 135–145)
Total Bilirubin: 0.7 mg/dL (ref 0.3–1.2)
Total Protein: 6.9 g/dL (ref 6.5–8.1)

## 2020-08-13 LAB — CBC
HCT: 43.9 % (ref 39.0–52.0)
Hemoglobin: 14.6 g/dL (ref 13.0–17.0)
MCH: 29.7 pg (ref 26.0–34.0)
MCHC: 33.3 g/dL (ref 30.0–36.0)
MCV: 89.2 fL (ref 80.0–100.0)
Platelets: 314 10*3/uL (ref 150–400)
RBC: 4.92 MIL/uL (ref 4.22–5.81)
RDW: 13.1 % (ref 11.5–15.5)
WBC: 13.6 10*3/uL — ABNORMAL HIGH (ref 4.0–10.5)
nRBC: 0 % (ref 0.0–0.2)

## 2020-08-13 LAB — SARS CORONAVIRUS 2 (TAT 6-24 HRS): SARS Coronavirus 2: NEGATIVE

## 2020-08-13 LAB — HIV ANTIBODY (ROUTINE TESTING W REFLEX): HIV Screen 4th Generation wRfx: NONREACTIVE

## 2020-08-13 MED ORDER — LEVETIRACETAM 750 MG PO TABS: 750.0000 mg | ORAL_TABLET | Freq: Two times a day (BID) | ORAL | 1 refills | Status: AC

## 2020-08-13 MED ORDER — LEVETIRACETAM 750 MG PO TABS: 750.0000 mg | ORAL_TABLET | Freq: Two times a day (BID) | ORAL | Status: DC

## 2020-08-13 MED ORDER — MIDAZOLAM 5 MG/ML ADULT INJ FOR INTRANASAL USE (MC USE ONLY): 5.0000 mg | Freq: Once | INTRAMUSCULAR | Status: DC

## 2020-08-13 NOTE — Care Management CC44 (Signed)
Condition Code 44 Documentation Completed  Patient Details  Name: Robert Peters. MRN: [id number] Date of Birth: Feb 05, 1956   Condition Code 44 given:  Yes Patient signature on Condition Code 44 notice:  Yes Documentation of 2 MD's agreement:  Yes Code 44 added to claim:  Yes    Dawayne Patricia, RN 08/13/2020, 3:56 PM

## 2020-08-13 NOTE — Procedures (Signed)
Patient Name: Robert Peters.  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  EEG generally for seizures.  Level of alertness:  lethargic   AEDs during EEG study: Keppra, Vimpat  Technical aspects: This EEG study was done with scalp electrodes positioned according to the 10-20 International system of electrode placement. Electrical activity was acquired at a sampling rate of 500Hz  and reviewed with a high frequency filter of 70Hz  and a low frequency filter of 1Hz . EEG data were recorded continuously and digitally stored.   Description: EEG showed periodic epileptiform discharges with overriding fast activity at 1.5 to 2 Hz in left frontal region which appeared rhythmic at times without definite evolution.  EEG also showed continuous generalized 3 to 5 Hz theta-delta slowing admixed with 15 to 18 Hz generalized beta activity.  Hyperventilation and photic stimulation were not performed.     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  The frequency of periodic discharges as well as morphology is on the ictal-interictal continuum with high potential for seizures.  Additionally there is evidence of moderate to severe diffuse encephalopathy, nonspecific etiology.   Seraphine Gudiel Barbra Sarks

## 2020-08-13 NOTE — Progress Notes (Signed)
Pt removed own EEG leads. Tech cleaned Pt. No skin breakdown was seen

## 2020-08-13 NOTE — Procedures (Signed)
Patient Name: Robert Peters.  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  EEG generally for seizures.  Level of alertness:  awake, asleep  AEDs during EEG study: Keppra, Vimpat  Technical aspects: This EEG study was done with scalp electrodes positioned according to the 10-20 International system of electrode placement. Electrical activity was acquired at a sampling rate of 500Hz  and reviewed with a high frequency filter of 70Hz  and a low frequency filter of 1Hz . EEG data were recorded continuously and digitally stored.   Description: The posterior dominant rhythm consists of 9 Hz activity of moderate voltage (25-35 uV) seen predominantly in posterior head regions, asymmetric ( left<right) and reactive to eye opening and eye closing.  Sleep was characterized by vertex waves, sleep spindles (12 to 14 Hz), maximal frontocentral region. EEG initially showed periodic epileptiform discharges with overriding fast activity at 1.5 to 2 Hz in left frontal region which gradually improved. EEG then showed spikes and sharply contoured slow waves in left frontal region which at times appeared rhythmic without definite evolution. EEG showed continuous 3 to 5 Hz theta delta slowing in left hemisphere, maximal left frontal region.  Hyperventilation and photic stimulation were not performed.     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 The sharp waves at times appeared rhythmic and is on  the ictal-interictal region with high potential for seizures.  Additionally there is evidence of cortical dysfunction left hemisphere, maximal left frontal region secondary to underlying metastatic disease.  [name]

## 2020-08-13 NOTE — Discharge Instructions (Signed)
Managing Non-Epileptic Seizures, Adult A non-epileptic seizure is an event that can cause abnormal movements or a loss of consciousness. It is not caused by abnormal electrical and chemical activity in the brain (epileptic seizure). Non-epileptic seizures are caused by an underlying problem with body function (physiologic) or a mental health disorder (psychogenic). If you have been diagnosed with non-epileptic seizures, you can do things to manage your symptoms. You may have to try different things to see what works best for you. Your health care provider may also give you specific instructions. How does this condition affect me? If your seizures are physiologic, your health care provider will treat the cause. These seizures are not likely to return, and they do not need further treatment. If you have psychogenic non-epileptic seizures (PNES), it is very important to understand that your PNES is a real illness. Your seizures are not fake. They just have a different cause than other seizures. PNES can be treated. Work with your mental health provider to find a treatment that works for you. Talk with your health care provider about what activities are safe to do until your seizures are controlled. What actions can I take to manage the condition? Create a plan for how to deal with your seizures.  After a seizure, make notes about what was associated with the stress that led to the seizure. Then, create a plan to manage this stress.  Think of ways to change the stresses you cannot avoid. How to manage lifestyle changes Managing stress  Certain types of counseling can be very helpful for managing stress. A mental health provider can assess what other treatments may also help you, such as: ? Talk therapy (cognitive behavioral therapy, or CBT). Through CBT, you will learn to identify and manage the psychological distress that leads to seizures. ? Medicine to treat depression or anxiety. ? Biofeedback.  This uses signals from your body (physiologic responses) to help you learn to regulate anxiety.  Consider self-care strategies to lower stress levels, such as: ? Doing breathing exercises, yoga, or meditation. ? Listening to music. ? Doing recreational therapy or organized exercise. ? Giving yourself calming messages. ? Calling a friend to talk about your stress.   Relationships You may benefit from talking with a trusted friend or family member about your thoughts and feelings.   General instructions Learn as much as you can about your non-epileptic seizures.  Consider educating the people in your life about your condition. These should be trusted family members and others who spend time with you at work, school, or home.  Ask for the emotional support you would like.  Keep all follow-up visits. This is important. Follow these instructions at home: Medicines Medicines may be prescribed to treat depression or anxiety that causes non-epileptic seizures. Avoid using alcohol and other substances that may prevent your medicines from working properly (may interact). It is also important to:  Talk with your pharmacist or health care provider about all the medicines that you take, their possible side effects, and what medicines are safe to take together.  Make it your goal to take part in all treatment decisions (shared decision-making). Ask about possible side effects of medicines that your health care provider recommends, and tell him or her how you feel about having those side effects. It is best if shared decision-making with your health care provider is part of your total treatment plan.  Take over-the-counter and prescription medicines only as told by your health care provider. General instructions    Ask your health care provider if it is safe for you to drive.  Return to your normal activities as told by your health care provider. Ask your health care provider what activities are safe  for you. These include working and playing sports.  Follow all instructions from your health care provider. These may include ways to prevent seizures and what to do if you have a seizure.  Eat a balanced diet.  Make sure you get full nights of sleep and regular daily exercise.  Keep all follow-up visits. This is important. Where to find support You can get support for managing non-epileptic seizures from support groups, either online or in-person. Your health care provider may be able to recommend a support group in your area. Where to find more information  Epilepsy Foundation: epilepsy.com  American Epilepsy Society: aesnet.org Contact a health care provider if:  Your seizures change or happen more often.  You continue to have seizures after treatment.  You show signs of depression or anxiety.  You have trouble doing your normal daily routine or work schedule. Get help right away if:  You injure yourself during a seizure.  You have one seizure after another.  You have trouble recovering from a seizure.  You have chest pain or trouble breathing.  A seizure lasts longer than 5 minutes.  You think about harming yourself or others. These symptoms may represent a serious problem that is an emergency. Do not wait to see if the symptoms will go away. Get medical help right away. Call your local emergency services (911 in the [place] Do not drive yourself to the hospital. If you ever feel like you may hurt yourself or others, or have thoughts about taking your own life, get help right away. Go to your nearest emergency department or:  Call your local emergency services (911 in the [place]  Call a suicide crisis helpline, such as the National Suicide Prevention Lifeline at 1-800-273-8255. This is open 24 hours a day in the [place]  Text the Crisis Text Line at 741741 (in the [place] Summary  The two types of non-epileptic seizures are physiologic non-epileptic seizures and  psychogenic non-epileptic seizures.  Work with your mental health provider to find a treatment that works for you.  Learning to recognize and avoid stress is an important part of preventing non-epileptic seizures. This information is not intended to replace advice given to you by your health care provider. Make sure you discuss any questions you have with your health care provider. Document Revised: 09/17/2019 Document Reviewed: 09/17/2019 Elsevier Patient Education  2021 [employer].  

## 2020-08-13 NOTE — Care Management Obs Status (Signed)
[place] NOTIFICATION   Patient Details  Name: Robert Peters. MRN: [id number] Date of Birth: 1955-04-09   Medicare Observation Status Notification Given:  Yes    Dawayne Patricia, [place] 08/13/2020, 3:55 PM

## 2020-08-13 NOTE — Discharge Summary (Signed)
Physician Discharge Summary  Robert Peters. DGU:[id number] DOB: 1955/11/29 DOA: 08/12/2020  PCP: [name], MD  Admit date: 08/12/2020 Discharge date: 08/13/2020  Admitted From: home Disposition:  home  Recommendations for Outpatient Follow-up:  [facility]  Home Health:no  Equipment/Devices: none  Discharge Condition: Stable Code Status:   Code Status: Full Code Diet recommendation:  Diet Order             Diet regular Room service appropriate? No; Fluid consistency: Thin  Diet effective now                 Brief/Interim Summary: 65 year old male with history of nonobstructive CAD HTN, HLD, history of prostate cancer, history of bladder cancer with brain metastasis with just had frustration treatment 5/25 brought to the ED after seizure episodes x2. In the ED,patient is unable to provide much history.  As per wife on 5/4 he is having right foot weakness progressed to right hemiparesis with frequent motor aphasia and mental status has declined underwent brain MRI 5/17 demonstrated metastasis was placed on a steroid and started on radiation therapy. In the ED patient was given Ativan, neurology was consulted and patient was admitted for further management.  CT head multiple known metastasis has not changed C-spine no evidence of fracture chest x-ray no acute abnormalities. Patient was a started on Keppra continue on dexamethasone and admitted. EEG showed epileptogenic discharges from the left frontal region.  Patient has returned to baseline seen by neurology advised to continue [place] and can use intranasal Versed for seizure at home.  Wife would really like him to go home today so that she can take him for radiation treatment at [facility] tomorrow understanding he is a high risk.  Informed her that neurology did okay to send him home today so he can continue with the treatment at [facility].  Neurology has called in intranasal Versed and polypharmacy  available was [facility] outpatient pharmacy at [place] wife has been informed about the location.  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  Continue Keppra 750 twice daily as outpatient and as needed intranasal versed up to 2 doses- with monitoring of respiration- Dr Hortense Ramal called in rx at [facility].   Bladder cancer with brain metastasis continue steroids with dexamethasone.  Was a started radiation therapy 08/12/20 at [facility] and was being planned for 10 daily sessions- I discussed w./ rad onc team here Dr Isidore Moos and rad-onc planning to see.But wife would like to go home today to continue with the radiation treatment at [facility] tomorrow and neurology okay to discharge.  Volume depletion improved continue p.o.  Leukocytosis likely from steroid.  Monitor for fever/infection Hypocalcemia: Calcium improved to 9.0.  Monitor Hyponatremia improved with fluids. Hyperglycemia: Likely from steroid  Mild hyperlipidemia statin stopped recently History of CAD nonobstructive recently stopped atorvastatin continue metoprolol and aspirin  Consults: Neurology  Subjective: Alert,awake, follows commands appropriately but expressive aphasia, wife at the bedside reports he has returned to his baseline.  Discharge Exam: Vitals:   08/13/20 1153 08/13/20 1623  BP: 136/88 119/71  Pulse: 68 81  Resp: 17 11  Temp: 98 F (36.7 C) 98.5 F (36.9 C)  SpO2: 93% 92%   General: Pt is alert, awake, not in acute distress Cardiovascular: RRR, S1/S2 +,  no rubs, no gallops Respiratory: CTA bilaterally, no wheezing, no rhonchi Abdominal: Soft, NT, ND, bowel sounds + Extremities: no edema, no cyanosis  Discharge Instructions  Discharge Instructions      Discharge instructions   Complete by: As directed    Please call call MD or return to ER for similar or worsening recurring problem that brought you to hospital or if any fever,nausea/vomiting,abdominal pain, uncontrolled pain, chest pain,  shortness of breath or any other alarming symptoms.  Use the medication VERSED as prescribed : 5 mg through the nose for seizure lasting more than 5 minutes, can repeat again one more time in another nostril, do not sue more than 2 doses.  Your pharmacy prescription has been called in at [facility]: [address] Floor, [place], [place], [place] [address] Hours: Open 24 hours Phone: [phone].     Follow-up with [facility] radiation treatment tomorrow.  Please follow-up your doctor as instructed in a week time and call the office for appointment.  Please avoid alcohol, smoking, or any other illicit substance and maintain healthy habits including taking your regular medications as prescribed.  You were cared for by a hospitalist during your hospital stay. If you have any questions about your discharge medications or the care you received while you were in the hospital after you are discharged, you can call the unit and ask to speak with the hospitalist on call if the hospitalist that took care of you is not available.  Once you are discharged, your primary care physician will handle any further medical issues. Please note that NO REFILLS for any discharge medications will be authorized once you are discharged, as it is imperative that you return to your primary care physician (or establish a relationship with a primary care physician if you do not have one) for your aftercare needs so that they can reassess your need for medications and monitor your lab values.  Seizure precautions: Per [facility] statutes, patients with seizures are not allowed to drive until they have been seizure-free for six months and cleared by  a physician    Use caution when using heavy equipment or power tools. Avoid working on ladders or at heights. Take showers instead of baths. Ensure the water temperature is not too high on the home water heater. Do not go swimming alone. Do not lock yourself in a room alone (i.e. bathroom). When caring for infants or small children, sit down when holding, feeding, or changing them to minimize risk of injury to the child in the event you have a seizure. Maintain good sleep hygiene. Avoid alcohol.    If patient has another seizure, call 911 and bring them back to the ED if: A.  The seizure lasts longer than 5 minutes.      B.  The patient doesn't wake shortly after the seizure or has new problems such as difficulty seeing, speaking or moving following the seizure C.  The patient was injured during the seizure D.  The patient has a temperature over 102 F (39C) E.  The patient vomited during the seizure and now is having trouble breathing    During the Seizure   - First, ensure adequate ventilation and place patients on the floor on their left side  Loosen clothing around the neck and ensure the airway is patent. If the patient is clenching the teeth, do not force the mouth open with any object as this can cause severe damage -  Remove all items from the surrounding that can be hazardous. The patient may be oblivious to what's happening and may not even know what he or she is doing. If the patient is confused and wandering, either gently guide him/her away and block access to outside areas - Reassure the individual and be comforting - Call 911. In most cases, the seizure ends before EMS arrives. However, there are cases when seizures may last over 3 to 5 minutes. Or the individual may have developed breathing difficulties or severe injuries. If a pregnant patient or a person with diabetes develops a seizure, it is prudent to call an ambulance. - Finally, if the patient does not regain full  consciousness, then call EMS. Most patients will remain confused for about 45 to 90 minutes after a seizure, so you must use judgment in calling for help. - Avoid restraints but make sure the patient is in a bed with padded side rails - Place the individual in a lateral position with the neck slightly flexed; this will help the saliva drain from the mouth and prevent the tongue from falling backward - Remove all nearby furniture and other hazards from the area - Provide verbal assurance as the individual is regaining consciousness - Provide the patient with privacy if possible - Call for help and start treatment as ordered by the caregiver    After the Seizure (Postictal Stage)   After a seizure, most patients experience confusion, fatigue, muscle pain and/or a headache. Thus, one should permit the individual to sleep. For the next few days, reassurance is essential. Being calm and helping reorient the person is also of importance.   Most seizures are painless and end spontaneously. Seizures are not harmful to others but can lead to complications such as stress on the lungs, brain and the heart. Individuals with prior lung problems may develop labored breathing and respiratory distress.      Allergies as of 08/13/2020       Reactions   Avelumab Other (See Comments)   Hypertension, shakes, chills, and creatine went up.        Medication List     STOP taking these medications    atorvastatin 80 MG tablet Commonly known as: LIPITOR       TAKE these medications    aspirin 81 MG chewable tablet Commonly known as: Aspirin Childrens Chew 1 tablet (81 mg total) by mouth daily.   dexamethasone 2 MG tablet Commonly known as: DECADRON Take 4 mg by mouth 2 (two) times daily.   levETIRAcetam 750 MG tablet Commonly known as: KEPPRA Take 1 tablet (750 mg total) by mouth 2 (two) times daily.   levothyroxine 112 MCG tablet Commonly known as: SYNTHROID Take 112 mcg by mouth in the  morning.   memantine 5 MG tablet Commonly known as: NAMENDA Take 5 mg by mouth daily.   metoprolol tartrate 25 MG tablet Commonly known as: LOPRESSOR TAKE 1 TABLET BY MOUTH TWICE A DAY   nitroGLYCERIN 0.4 MG SL tablet Commonly known as: NITROSTAT Place 1 tablet (0.4 mg total) under the tongue every 5 (five) minutes x 3 doses as needed for chest pain.        Follow-up Information     Koirala, Dibas, MD Follow up.   Specialty: Family Medicine Contact information: [place] [address] [phone]         [facility] Follow up in 1 day(s).  Allergies  Allergen Reactions   Avelumab Other (See Comments)    Hypertension, shakes, chills, and creatine went up.    The results of significant diagnostics from this hospitalization (including imaging, microbiology, ancillary and laboratory) are listed below for reference.    Microbiology: Recent Results (from the past 240 hour(s))  SARS CORONAVIRUS 2 (TAT 6-24 HRS) Nasopharyngeal Nasopharyngeal Swab     Status: None   Collection Time: 08/12/20  7:41 PM   Specimen: Nasopharyngeal Swab  Result Value Ref Range Status   SARS Coronavirus 2 NEGATIVE NEGATIVE Final    Comment: (NOTE) SARS-CoV-2 target nucleic acids are NOT DETECTED.  The SARS-CoV-2 RNA is generally detectable in upper and lower respiratory specimens during the acute phase of infection. Negative results do not preclude SARS-CoV-2 infection, do not rule out co-infections with other pathogens, and should not be used as the sole basis for treatment or other patient management decisions. Negative results must be combined with clinical observations, patient history, and epidemiological information. The expected result is Negative.  Fact Sheet for Patients: SugarRoll.be  Fact Sheet for Healthcare Providers: https://www.woods-mathews.com/  This test is not  yet approved or cleared by the [place] FDA and  has been authorized for detection and/or diagnosis of SARS-CoV-2 by FDA under an Emergency Use Authorization (EUA). This EUA will remain  in effect (meaning this test can be used) for the duration of the COVID-19 declaration under Se ction 564(b)(1) of the Act, 21 U.S.C. section 360bbb-3(b)(1), unless the authorization is terminated or revoked sooner.  Performed at [facility], [place] [address] [place], [place] [address]     Procedures/Studies: CT Head Wo Contrast  Result Date: 08/12/2020 CLINICAL DATA:  Seizure, nontraumatic. History of brain metastases, new onset seizure, altered mental status. EXAM: CT HEAD WITHOUT CONTRAST CT CERVICAL SPINE WITHOUT CONTRAST TECHNIQUE: Multidetector CT imaging of the head and cervical spine was performed following the standard protocol without intravenous contrast. Multiplanar CT image reconstructions of the cervical spine were also generated. COMPARISON:  Brain MRI 08/04/2020. FINDINGS: CT HEAD FINDINGS Brain: Mild cerebral atrophy. Multiple known supratentorial brain metastases have not appreciably changed as compared to the recent prior brain MRI of 08/04/2020. Unchanged vasogenic edema surrounding these lesions, most notably within the left frontal, parietal and temporal lobes. Unchanged 6 mm rightward midline shift measured at the level of the septum pellucidum (series 5, image 30). Unchanged prominence of the left lateral ventricle temporal horn likely reflecting some degree of ventricular entrapment. No evidence of acute intracranial hemorrhage. No acute demarcated cortical infarct is identified. No extra-axial fluid collection. Vascular: Hyperdense vessel.  Atherosclerotic calcifications Skull: Normal. Negative for fracture or focal lesion. Sinuses/Orbits: Visualized orbits show no acute finding. Mild bilateral ethmoid sinus mucosal thickening. Small mucous retention cysts within the right  maxillary sinus. CT CERVICAL SPINE FINDINGS Alignment: No significant spondylolisthesis. Skull base and vertebrae: The basion-dental and atlanto-dental intervals are maintained.No evidence of acute fracture to the cervical spine. Soft tissues and spinal canal: No prevertebral fluid or swelling. No visible canal hematoma. Disc levels: Cervical spondylosis. No more than mild disc space narrowing. Shallow multilevel disc bulges/central disc protrusions. No more than mild relative spinal canal narrowing appreciated at any level. No significant bony neural foraminal narrowing. Upper chest: No consolidation within the imaged lung apices. No visible pneumothorax. IMPRESSION: CT head: 1. Multiple known supratentorial brain metastases have not appreciably changed as compared to the recent prior brain MRI of 08/04/2020. Unchanged vasogenic edema surrounding these lesions, most notably within  the left frontal, parietal and temporal lobes. 2. Unchanged mass effect with 6 mm rightward midline shift and partial effacement of the left lateral ventricle. 3. Unchanged prominence of the left lateral ventricle temporal horn, likely reflecting some degree of ventricular entrapment. 4. Paranasal sinus disease, as described. CT cervical spine: 1. No evidence of acute fracture to the cervical spine. 2. Cervical spondylosis, as described. Electronically Signed   By: Kellie Simmering DO   On: 08/12/2020 20:21   CT Cervical Spine Wo Contrast  Result Date: 08/12/2020 CLINICAL DATA:  Seizure, nontraumatic. History of brain metastases, new onset seizure, altered mental status. EXAM: CT HEAD WITHOUT CONTRAST CT CERVICAL SPINE WITHOUT CONTRAST TECHNIQUE: Multidetector CT imaging of the head and cervical spine was performed following the standard protocol without intravenous contrast. Multiplanar CT image reconstructions of the cervical spine were also generated. COMPARISON:  Brain MRI 08/04/2020. FINDINGS: CT HEAD FINDINGS Brain: Mild cerebral  atrophy. Multiple known supratentorial brain metastases have not appreciably changed as compared to the recent prior brain MRI of 08/04/2020. Unchanged vasogenic edema surrounding these lesions, most notably within the left frontal, parietal and temporal lobes. Unchanged 6 mm rightward midline shift measured at the level of the septum pellucidum (series 5, image 30). Unchanged prominence of the left lateral ventricle temporal horn likely reflecting some degree of ventricular entrapment. No evidence of acute intracranial hemorrhage. No acute demarcated cortical infarct is identified. No extra-axial fluid collection. Vascular: Hyperdense vessel.  Atherosclerotic calcifications Skull: Normal. Negative for fracture or focal lesion. Sinuses/Orbits: Visualized orbits show no acute finding. Mild bilateral ethmoid sinus mucosal thickening. Small mucous retention cysts within the right maxillary sinus. CT CERVICAL SPINE FINDINGS Alignment: No significant spondylolisthesis. Skull base and vertebrae: The basion-dental and atlanto-dental intervals are maintained.No evidence of acute fracture to the cervical spine. Soft tissues and spinal canal: No prevertebral fluid or swelling. No visible canal hematoma. Disc levels: Cervical spondylosis. No more than mild disc space narrowing. Shallow multilevel disc bulges/central disc protrusions. No more than mild relative spinal canal narrowing appreciated at any level. No significant bony neural foraminal narrowing. Upper chest: No consolidation within the imaged lung apices. No visible pneumothorax. IMPRESSION: CT head: 1. Multiple known supratentorial brain metastases have not appreciably changed as compared to the recent prior brain MRI of 08/04/2020. Unchanged vasogenic edema surrounding these lesions, most notably within the left frontal, parietal and temporal lobes. 2. Unchanged mass effect with 6 mm rightward midline shift and partial effacement of the left lateral ventricle. 3.  Unchanged prominence of the left lateral ventricle temporal horn, likely reflecting some degree of ventricular entrapment. 4. Paranasal sinus disease, as described. CT cervical spine: 1. No evidence of acute fracture to the cervical spine. 2. Cervical spondylosis, as described. Electronically Signed   By: Kellie Simmering DO   On: 08/12/2020 20:21   MR BRAIN WO CONTRAST  Addendum Date: 08/04/2020   ADDENDUM REPORT: 08/04/2020 09:41 ADDENDUM: Salient findings discussed by telephone with Dr. London Pepper (covering for Dr. [name]) on 08/04/2020 at 0935 hours. Electronically Signed   By: Genevie Ann M.D.   On: 08/04/2020 09:41   Result Date: 08/04/2020 CLINICAL DATA:  65 year old male with a history of bladder cancer and persistent right leg weakness. Noncontrast study ordered and pre certified. EXAM: MRI HEAD WITHOUT CONTRAST TECHNIQUE: Multiplanar, multiecho pulse sequences of the brain and surrounding structures were obtained without intravenous contrast. COMPARISON:  None. FINDINGS: Brain: Multiple T2 and FLAIR hyperintense, T1 hypointense rounded and lobulated brain masses are scattered  in both cerebral hemispheres. These demonstrate abnormal diffusion in keeping with hypercellularity. Approximately 8 distinct lesions are identified ranging from 18 mm to roughly 40 mm diameter (with all 3 of the largest lesions located in the left hemisphere). Bilateral frontal and occipital lobe involvement predominates. Relatively mild vasogenic edema surrounds the largest lesions, and is most pronounced in the left temporal lobe. There is faint hemosiderin or mineralization associated with only the left superior frontal gyrus lesion. Deep gray matter nuclei, brainstem and cerebellum seem to be spared. There is mild rightward midline shift of 5-6 mm but the basilar cisterns remain normal. No ventriculomegaly. No superimposed restricted diffusion suggestive of acute infarction. No acute intracranial hemorrhage.  Cervicomedullary junction and pituitary are within normal limits. Vascular: Loss of the distal right vertebral artery flow void at the skull base (series 7, image 5). Other Major intracranial vascular flow voids are preserved. Skull and upper cervical spine: Negative. Visualized bone marrow signal is within normal limits. Sinuses/Orbits: Negative orbits. Trace paranasal sinus mucosal thickening. Other: Mastoids are clear. Grossly normal visible internal auditory structures. Visible scalp and face soft tissues appear negative. IMPRESSION: 1. Multiple brain metastases. Approximately Eight hypercellular masses ranging from 1.8 cm to 4.0 cm diameter. But associated vasogenic edema and mass effect is relatively mild. And while there is 5-6 mL of rightward midline shift the basilar cisterns remain normal. 2. Attempts are being made to arrange follow-up post-contrast imaging of the brain, which once obtained might be reported separately or as an addendum to this report. 3. 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 Electronically Signed: By: Genevie Ann M.D. On: 08/04/2020 09:22   DG Pelvis Portable  Result Date: 08/12/2020 CLINICAL DATA:  Fall. EXAM: PORTABLE PELVIS 1-2 VIEWS COMPARISON:  None. FINDINGS: There is no evidence of pelvic fracture or diastasis. No pelvic bone lesions are seen. IMPRESSION: Negative. Electronically Signed   By: Marijo Conception M.D.   On: 08/12/2020 20:17   DG Chest Portable 1 View  Result Date: 08/12/2020 CLINICAL DATA:  Fall, seizure, concern for trauma EXAM: PORTABLE CHEST 1 VIEW COMPARISON:  Radiograph 12/27/2019 FINDINGS: Low lung volumes. Streaky basilar opacities are present in the lung bases more focal density in left lung base possibly reflecting further subsegmental atelectatic change or aspiration in the setting of seizure. No pneumothorax. No effusion. Cardiomediastinal contours are similar to prior counting for differences in technique. Telemetry  leads overlie the chest. Cervical stabilization collar in place. IMPRESSION: Low volumes and atelectasis. More focal opacity in left lung base, possible subsegmental atelectasis though cannot exclude underlying airspace process such as aspiration in the setting of seizure. No acute traumatic abnormality of the chest wall. Electronically Signed   By: Lovena Le M.D.   On: 08/12/2020 20:17   Overnight EEG with video  Result Date: 08/13/2020 Lora Havens, MD     08/13/2020  4:44 PM Patient Name: Robert Peters. MRN: [id number] Epilepsy Attending: Lora Havens Referring Physician/Provider: Dr Tennis Must Duration: 08/12/2020 2328 to 08/13/2020 1456   Patient history: 65 year old male with left greater than right multiple brain mets presented with generalized tonic-clonic seizures.  EEG generally for seizures.   Level of alertness:  awake, asleep   AEDs during EEG study: Keppra, Vimpat   Technical aspects: This EEG study was done with scalp electrodes positioned according to the 10-20 International system of electrode placement. Electrical activity was acquired at a sampling rate of 500Hz  and reviewed with a high frequency filter  of 70Hz  and a low frequency filter of 1Hz . EEG data were recorded continuously and digitally stored.   Description: The posterior dominant rhythm consists of 9 Hz activity of moderate voltage (25-35 uV) seen predominantly in posterior head regions, asymmetric ( left<right) and reactive to eye opening and eye closing.  Sleep was characterized by vertex waves, sleep spindles (12 to 14 Hz), maximal frontocentral region. EEG initially showed periodic epileptiform discharges with overriding fast activity at 1.5 to 2 Hz in left frontal region which gradually improved. EEG then showed spikes and sharply contoured slow waves in left frontal region which at times appeared rhythmic without definite evolution. EEG showed continuous 3 to 5 Hz theta delta slowing in left hemisphere, maximal  left frontal region.  Hyperventilation and photic stimulation were not performed.     ABNORMALITY - Lateralized periodic discharges with overriding fast activity (LPD+), left frontal region -Sharp wave, left frontal region - Continuous slow, left hemisphere, maximal frontal region - Background asymmetry, left<right   IMPRESSION: This study showed evidence of epileptogenicity arising from left frontal region. The sharp waves at times appeared rhythmic and is on the ictal-interictal region with high potential for seizures.  Additionally there is evidence of cortical dysfunction left hemisphere, maximal left frontal region secondary to underlying metastatic disease.   [place]: BNP (last 3 results) No results for input(s): BNP in the last 8760 hours. Basic Metabolic Panel: Recent Labs  Lab 08/12/20 1940 08/13/20 0320  NA 133* 138  K 3.9 4.2  CL 99 103  CO2 21* 23  GLUCOSE 138* 128*  BUN 33* 29*  CREATININE 1.00 0.89  CALCIUM 8.7* 9.0  MG 2.1  --    Liver Function Tests: Recent Labs  Lab 08/13/20 0320  AST 23  ALT 27  ALKPHOS 56  BILITOT 0.7  PROT 6.9  ALBUMIN 3.2*   No results for input(s): LIPASE, AMYLASE in the last 168 hours. No results for input(s): AMMONIA in the last 168 hours. CBC: Recent Labs  Lab 08/12/20 1940 08/13/20 0320  WBC 12.0* 13.6*  NEUTROABS 10.1*  --   HGB 14.8 14.6  HCT 45.3 43.9  MCV 90.8 89.2  PLT 292 314   Cardiac Enzymes: No results for input(s): CKTOTAL, CKMB, CKMBINDEX, TROPONINI in the last 168 hours. BNP: Invalid input(s): POCBNP CBG: No results for input(s): GLUCAP in the last 168 hours. D-Dimer No results for input(s): DDIMER in the last 72 hours. Hgb A1c No results for input(s): HGBA1C in the last 72 hours. Lipid Profile No results for input(s): CHOL, HDL, LDLCALC, TRIG, CHOLHDL, LDLDIRECT in the last 72 hours. Thyroid function studies No results for input(s): TSH, T4TOTAL, T3FREE, THYROIDAB in the last 72  hours.  Invalid input(s): FREET3 Anemia work up No results for input(s): VITAMINB12, FOLATE, FERRITIN, TIBC, IRON, RETICCTPCT in the last 72 hours. Urinalysis    Component Value Date/Time   COLORURINE ORANGE (A) 10/21/2017 1039   APPEARANCEUR CLOUDY (A) 10/21/2017 1039   LABSPEC 1.023 10/21/2017 1039   PHURINE 6.0 10/21/2017 1039   GLUCOSEU NEGATIVE 10/21/2017 1039   HGBUR LARGE (A) 10/21/2017 1039   BILIRUBINUR NEGATIVE 10/21/2017 1039   KETONESUR NEGATIVE 10/21/2017 1039   PROTEINUR 100 (A) 10/21/2017 1039   NITRITE NEGATIVE 10/21/2017 1039   LEUKOCYTESUR NEGATIVE 10/21/2017 1039   Sepsis Labs Invalid input(s): PROCALCITONIN,  WBC,  LACTICIDVEN Microbiology Recent Results (from the past 240 hour(s))  SARS CORONAVIRUS 2 (TAT 6-24 HRS) Nasopharyngeal Nasopharyngeal Swab  Status: None   Collection Time: 08/12/20  7:41 PM   Specimen: Nasopharyngeal Swab  Result Value Ref Range Status   SARS Coronavirus 2 NEGATIVE NEGATIVE Final    Comment: (NOTE) SARS-CoV-2 target nucleic acids are NOT DETECTED.  The SARS-CoV-2 RNA is generally detectable in upper and lower respiratory specimens during the acute phase of infection. Negative results do not preclude SARS-CoV-2 infection, do not rule out co-infections with other pathogens, and should not be used as the sole basis for treatment or other patient management decisions. Negative results must be combined with clinical observations, patient history, and epidemiological information. The expected result is Negative.  Fact Sheet for Patients: SugarRoll.be  Fact Sheet for Healthcare Providers: https://www.woods-mathews.com/  This test is not yet approved or cleared by the [place] FDA and  has been authorized for detection and/or diagnosis of SARS-CoV-2 by FDA under an Emergency Use Authorization (EUA). This EUA will remain  in effect (meaning this test can be used) for the duration of  the COVID-19 declaration under Se ction 564(b)(1) of the Act, 21 U.S.C. section 360bbb-3(b)(1), unless the authorization is terminated or revoked sooner.  Performed at [facility], [place] [address] [place], [place] [address]      Time coordinating discharge: 25 minutes  SIGNED: Antonieta Pert, MD  [facility] 08/13/2020, 5:23 PM  If 7PM-7AM, please contact night-coverage [email or web address]  08/13/2020, 5:23 PM

## 2020-08-13 NOTE — Progress Notes (Signed)
I called and spoke with the patient's wife about the ability to treat her husband with radiation here at our cancer center. He's received a single fraction of the planned 10 fractions at [facility] to his whole brain. He is actually getting discharged today so he can resume treatment there. I let her know we'd be happy to help treat him but that due to simulation needing to be redone, making an immobilizing mask, and the timing of planning treatment, we wouldn't be able to start XRT here until Tuesday. We would be happy to help if needed in the future, but unfortunately the logistics do not seem to meet her goals of resuming treatment tomorrow.     Carola Rhine, PAC

## 2020-08-13 NOTE — Progress Notes (Signed)
EEG complete - results pending 

## 2020-08-13 NOTE — Plan of Care (Signed)

## 2020-08-13 NOTE — Progress Notes (Addendum)
Subjective: No further seizures overnight.  Wife at bedside states patient has had speech disturbance since about 2 weeks.  He had his first radiation yesterday morning and then had a seizure yesterday afternoon.  Denies any seizures overnight.  Wife states patient missed his radiation appointment today and has another appointment with radiation tomorrow at [facility] followed by on Friday and then on Tuesday.  She would prefer to continue with treatment at [facility] if possible.  ROS: unable to obtain due to aphasia  Examination  Vital signs in last 24 hours: Temp:  [97.4 F (36.3 C)-98.4 F (36.9 C)] 98 F (36.7 C) (05/26 1153) Pulse Rate:  [58-87] 68 (05/26 1153) Resp:  [14-23] 17 (05/26 1153) BP: (117-150)/(75-104) 136/88 (05/26 1153) SpO2:  [93 %-97 %] 93 % (05/26 1153) Weight:  [91.5 kg] 91.5 kg (05/25 1957)  General: lying in bed, not in apparent distress CVS: pulse-normal rate and rhythm RS: breathing comfortably, CTAB Extremities: normal, warm Neuro: Awake, alert, follows commands, able to tell me his name, able to repeat intermittently, but was perseverating and had nonsensical speech (expressive aphasia), PERRLA, EOMI, no gaze deviation, no apparent facial asymmetry, rest of cranial nerves appear grossly intact, antigravity strength in all 4 extremities with left more than right  Basic Metabolic Panel: Recent Labs  Lab 08/12/20 1940 08/13/20 0320  NA 133* 138  K 3.9 4.2  CL 99 103  CO2 21* 23  GLUCOSE 138* 128*  BUN 33* 29*  CREATININE 1.00 0.89  CALCIUM 8.7* 9.0  MG 2.1  --     CBC: Recent Labs  Lab 08/12/20 1940 08/13/20 0320  WBC 12.0* 13.6*  NEUTROABS 10.1*  --   HGB 14.8 14.6  HCT 45.3 43.9  MCV 90.8 89.2  PLT 292 314     Coagulation Studies: No results for input(s): LABPROT, INR in the last 72 hours.  Imaging CT head without contrast 08/12/2020:  Multiple known supratentorial brain metastases have not appreciably changed as compared to the recent prior  brain MRI of 08/04/2020. Unchanged vasogenic edema surrounding these lesions, most notably within the left frontal, parietal and temporal lobes. Unchanged mass effect with 6 mm rightward midline shift and  partial effacement of the left lateral ventricle.      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  However, patient's wife reports he missed his radiation appointment today and has another appointment at [facility] tomorrow morning.  She would like to continue treatment at [facility] if possible.  I discussed the risk of early discharge the patient may have another breakthrough seizure.  However I do believe that the chances of seizure recurrence is lower now that patient is on antiseizure medication.  Also, because the seizures are due to underlying metastatic disease, patient will need to continue his radiation.  After discussing risk and benefits, we decided to discharge patient today. - Will also prescribe intranasal versed 5mg  to be used for seizure lasting more than 5 minutes. Can repeat dose one time in another nostril if seizure continues  after 10 minutes. Do not administer more than 2 doses  - Discussed seizure precautions  ADDENDUM - Patient's wife called stating intranasal copay is $285 and requested a different medication. Called in Clonazepam ODT 0.25mg . Take one tablet for seizure lasting over 5 mins.    Seizure precautions: Per [facility] statutes, patients with seizures are not allowed to drive until they have been seizure-free for six months and cleared by a physician    Use caution when using heavy equipment or power tools. Avoid working on ladders or at heights. Take showers instead of baths. Ensure the water temperature is not too high on the home water heater. Do not go swimming alone. Do not lock yourself in a room alone (i.e. bathroom). When caring for infants or small children, sit down when holding, feeding, or changing them to minimize risk of injury to the child in the event you have a seizure. Maintain good sleep hygiene. Avoid alcohol.    If patient has another seizure, call 911 and bring them back to the ED if: A.  The seizure lasts longer than 5 minutes.      B.  The patient doesn't wake shortly after the seizure or has new problems such as difficulty seeing, speaking or moving following the seizure C.  The patient was injured during the seizure D.  The patient has a temperature over 102 F (39C) E.  The patient vomited during the seizure and now is having trouble breathing    During the Seizure   - First, ensure adequate ventilation and place patients on the floor on their left side  Loosen clothing around the neck and ensure the airway is patent. If the patient is clenching the teeth, do not force the mouth open with any object as this can cause severe damage - Remove all items from the surrounding that can be hazardous. The patient may be oblivious to what's happening and may not even know what he or she is doing. If the patient is confused and wandering, either gently guide him/her away and block access to outside areas - Reassure the individual and be comforting - Call 911. In most cases, the seizure ends before EMS arrives. However, there are cases when seizures may last over 3 to 5 minutes. Or the individual may have developed breathing difficulties or severe injuries. If a pregnant patient or a person with  diabetes develops a seizure, it is prudent to call an ambulance. - Finally, if the patient does not regain full consciousness, then call EMS. Most patients will remain confused for about 45 to 90 minutes after a seizure, so you must use judgment in calling for help. - Avoid restraints but make sure the patient is in a bed with padded side rails - Place the individual in a lateral position with the neck slightly flexed; this will help the saliva drain from the mouth and prevent the tongue from falling backward - Remove all nearby furniture and other hazards from the area - Provide verbal assurance as the individual is regaining consciousness - Provide the patient with privacy if possible - Call for help and start treatment as ordered by the caregiver    After the Seizure (Postictal Stage)   After a seizure, most patients experience confusion, fatigue, muscle pain and/or a headache. Thus, one should permit the individual to sleep. For the next few days, reassurance is essential. Being calm and helping reorient the person is also of importance.   Most seizures are  painless and end spontaneously. Seizures are not harmful to others but can lead to complications such as stress on the lungs, brain and the heart. Individuals with prior lung problems may develop labored breathing and respiratory distress.   I have spent a total of  35 minutes with the patient reviewing hospital notes,  test results, labs and examining the patient as well as establishing an assessment and plan that was discussed personally with the patient's wife at bedside.  > 50% of time was spent in direct patient care.   Zeb Comfort Epilepsy [facility] For questions after 5pm please refer to AMION to reach the Neurologist on call

## 2020-08-14 DIAGNOSIS — C7931 Secondary malignant neoplasm of brain: Secondary | ICD-10-CM | POA: Diagnosis not present

## 2020-08-18 ENCOUNTER — Telehealth: Payer: Self-pay

## 2020-08-18 DIAGNOSIS — C7931 Secondary malignant neoplasm of brain: Secondary | ICD-10-CM | POA: Diagnosis not present

## 2020-08-18 NOTE — Telephone Encounter (Signed)
Attempted to contact patient's wife Eustaquio Maize to schedule a Palliative Care consult appointment. No answer left a message to return call.

## 2020-08-19 ENCOUNTER — Telehealth: Payer: Self-pay

## 2020-08-19 NOTE — Telephone Encounter (Signed)
Spoke with patient's wife Alton. She decided to decline PC at this time. She prefers to discuss Hospice care with patient's MD. Referral canceled. Did not notify MD because wife has already put in a call to discuss Hospice.

## 2020-08-20 ENCOUNTER — Ambulatory Visit: Payer: BC Managed Care – PPO | Admitting: Cardiology

## 2020-08-26 DIAGNOSIS — Z936 Other artificial openings of urinary tract status: Secondary | ICD-10-CM | POA: Diagnosis not present

## 2020-09-18 DEATH — deceased

## 2020-09-19 ENCOUNTER — Other Ambulatory Visit: Payer: Self-pay | Admitting: Cardiology

## 2022-08-08 IMAGING — DX DG CHEST 1V PORT
1 series · 1 of 1 positions shown · non-contrast
Comparison: Radiograph 12/27/2019

CLINICAL DATA: Fall, seizure, concern for trauma

EXAM:
PORTABLE CHEST 1 VIEW

[chest]
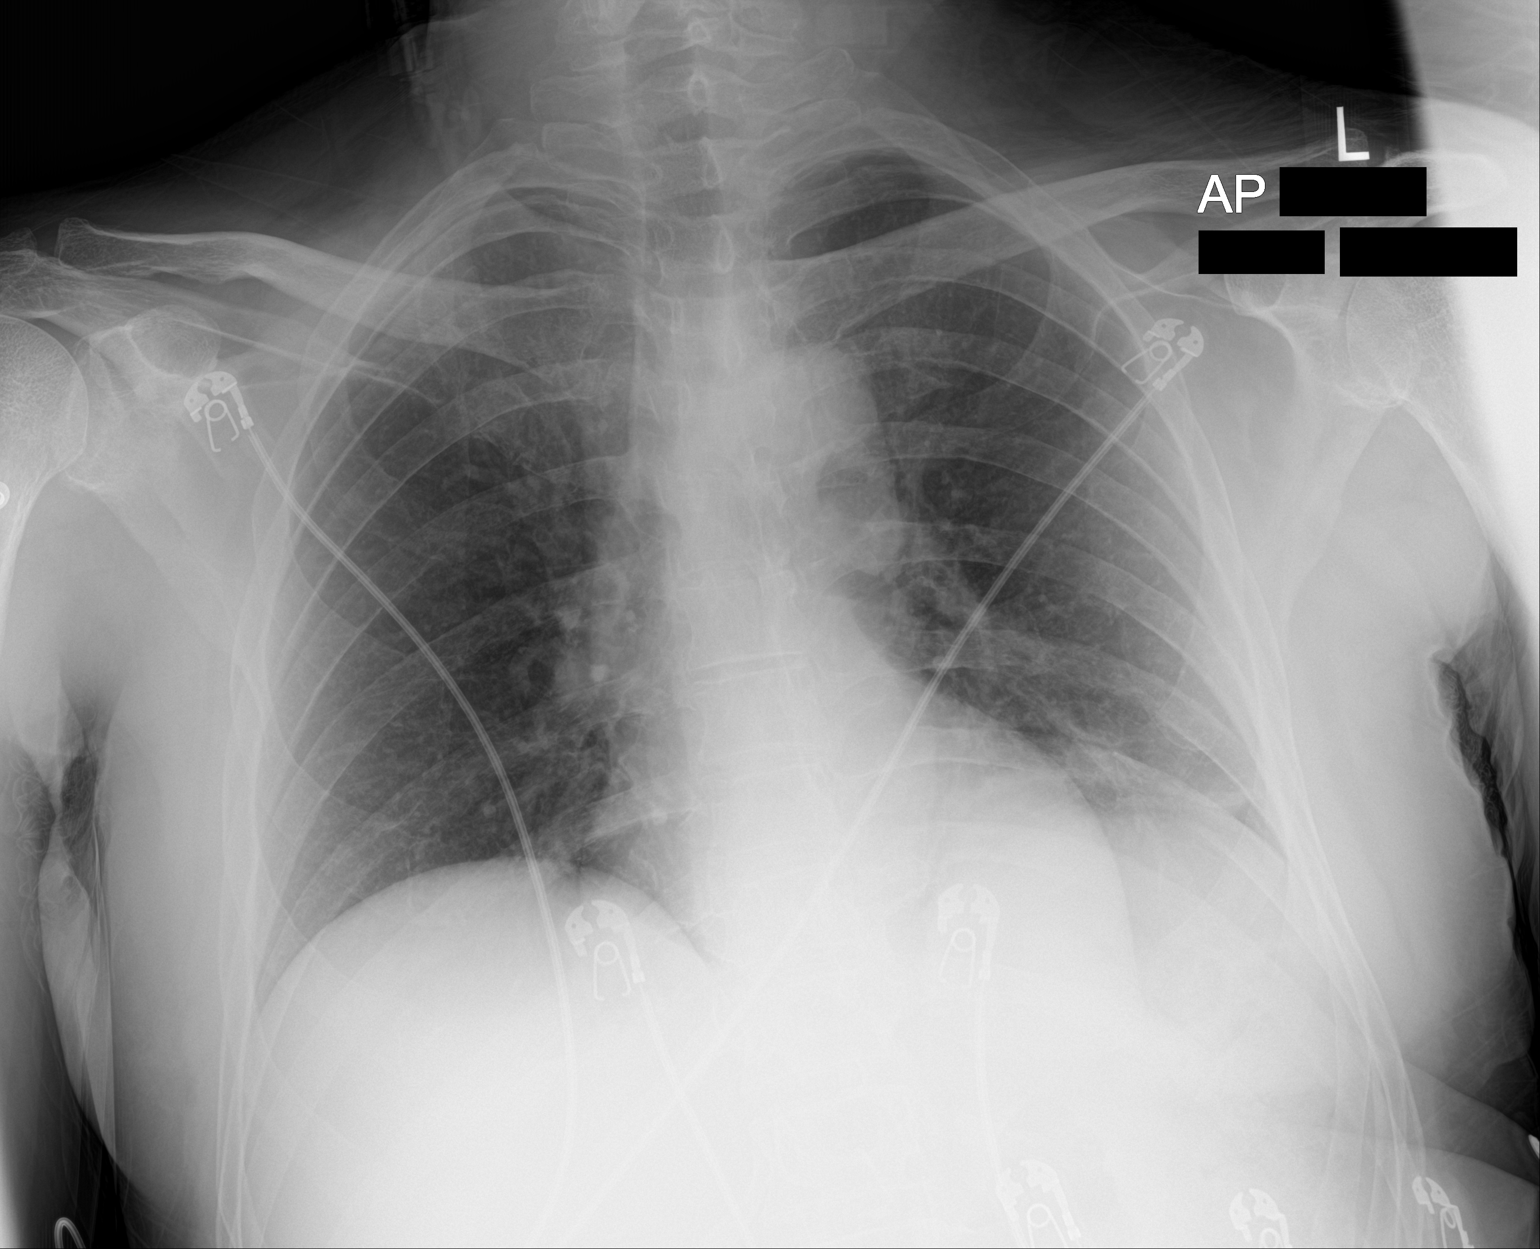

[1 of 1 positions shown; findings below may reference images not displayed]

FINDINGS: Low lung volumes. Streaky basilar opacities are present in the lung
bases more focal density in left lung base possibly reflecting
further subsegmental atelectatic change or aspiration in the setting
of seizure. No pneumothorax. No effusion. Cardiomediastinal contours
are similar to prior counting for differences in technique.
Telemetry leads overlie the chest. Cervical stabilization collar in
place.
IMPRESSION: Low volumes and atelectasis.

More focal opacity in left lung base, possible subsegmental
atelectasis though cannot exclude underlying airspace process such
as aspiration in the setting of seizure.

No acute traumatic abnormality of the chest wall.
